# Patient Record
Sex: Female | Born: 1995 | Race: Black or African American | Hispanic: No | Marital: Single | Smoking: Never smoker
Health system: Southern US, Community
[De-identification: ages and names within clinical notes are randomized; demographics above are authoritative.]

## PROBLEM LIST (undated history)

## (undated) DIAGNOSIS — R569 Unspecified convulsions: Secondary | ICD-10-CM

## (undated) DIAGNOSIS — J302 Other seasonal allergic rhinitis: Secondary | ICD-10-CM

---
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
## 2016-01-08 ENCOUNTER — Ambulatory Visit: Payer: Self-pay | Admitting: Gastroenterology

## 2016-01-08 ENCOUNTER — Other Ambulatory Visit: Payer: Self-pay

## 2016-09-02 ENCOUNTER — Emergency Department: Payer: Medicaid Other

## 2016-09-02 ENCOUNTER — Encounter: Payer: Self-pay | Admitting: Urgent Care

## 2016-09-02 DIAGNOSIS — Z79899 Other long term (current) drug therapy: Secondary | ICD-10-CM | POA: Diagnosis not present

## 2016-09-02 DIAGNOSIS — R51 Headache: Secondary | ICD-10-CM | POA: Insufficient documentation

## 2016-09-02 NOTE — ED Triage Notes (Signed)
Patient presents with c/o an occipital headache that began earlier tonight. Patient is dizzy. Denies neck pain and visual changes. 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 Also, patient with a first line relative with a PMH significant for a cerebral aneurysm.

## 2016-09-02 NOTE — ED Notes (Signed)
Brown,MD consulted. MD made aware of presenting complaints and triage assessment. MD with VORB for CT angio of head with contrast. Orders to be entered and carried by this RN.

## 2016-09-03 ENCOUNTER — Emergency Department
Admission: EM | Admit: 2016-09-03 | Discharge: 2016-09-03 | Disposition: A | Discharge status: Home / Self Care | Payer: Medicaid Other | Attending: Emergency Medicine | Admitting: Emergency Medicine

## 2016-09-03 DIAGNOSIS — R51 Headache: Secondary | ICD-10-CM

## 2016-09-03 DIAGNOSIS — R519 Headache, unspecified: Secondary | ICD-10-CM

## 2016-09-03 HISTORY — DX: Unspecified convulsions: R56.9

## 2016-09-03 LAB — POCT PREGNANCY, URINE: PREG TEST UR: NEGATIVE

## 2016-09-03 MED ORDER — IBUPROFEN 600 MG PO TABS
600.0000 mg | ORAL_TABLET | Freq: Once | ORAL | Status: AC
  Administered 2016-09-03: 600 mg via ORAL
  Filled 2016-09-03: qty 1

## 2016-09-03 MED ORDER — IOPAMIDOL (ISOVUE-370) INJECTION 76%
75.0000 mL | Freq: Once | INTRAVENOUS | Status: AC | PRN
  Administered 2016-09-03: 75 mL via INTRAVENOUS

## 2016-09-03 NOTE — ED Notes (Signed)
MD at bedside. 

## 2016-09-09 NOTE — ED Provider Notes (Signed)
[facility] Emergency Department Provider Note    First MD Initiated Contact with Patient 09/03/16 0143     (approximate)  I have reviewed the triage vital signs and the nursing notes.   HISTORY  Chief Complaint Headache and Dizziness   HPI Jean Knapp is a 20 y.o. female presents emergency department with occipital headache which began earlier tonight. Patient admits to feeling dizzy however dizziness is resolved. Patient denies any visual changes no neck pain or stiffness. Patient's mother states that the child had a blood clot in her brain as a baby and as a result had a seizure disorder following. Patient's mother also states that there is a history of cerebral aneurysms in the family   Past Medical History:  Diagnosis Date  . Seasonal allergies   . Seizures [facility](HCC)     Patient Active Problem List   Diagnosis Date Noted  . Bradycardia 07/16/2015  . Early satiety 07/16/2015  . Abnormal weight loss 07/16/2015    No past surgical history on file.  Prior to Admission medications   Medication Sig Start Date End Date Taking? Authorizing Provider  cetirizine (ZYRTEC) 10 MG tablet Take 10 mg by mouth.    Historical Provider, MD  polyethylene glycol powder (GLYCOLAX/MIRALAX) powder Take by mouth. 07/18/15   Historical Provider, MD    Allergies No known drug allergies No family history on file.  Social History Social History  Substance Use Topics  . Smoking status: Never Smoker  . Smokeless tobacco: Never Used  . Alcohol use No    Review of Systems Constitutional: No fever/chills Eyes: No visual changes. ENT: No sore throat. Cardiovascular: Denies chest pain. Respiratory: Denies shortness of breath. Gastrointestinal: No abdominal pain.  No nausea, no vomiting.  No diarrhea.  No constipation. Genitourinary: Negative for dysuria. Musculoskeletal: Negative for back pain. Skin: Negative for rash. Neurological: Positive for headaches,  negative for focal weakness or numbness.  10-point ROS otherwise negative.  ____________________________________________   PHYSICAL EXAM:  VITAL SIGNS: ED Triage Vitals  Enc Vitals Group     BP 09/02/16 2317 (!) 144/82     Pulse Rate 09/02/16 2317 80     Resp 09/02/16 2317 16     Temp 09/02/16 2317 98.6 F (37 C)     Temp Source 09/02/16 2317 Oral     SpO2 09/02/16 2317 100 %     Weight 09/02/16 2318 121 lb (54.9 kg)     Height 09/02/16 2318 5\' 1"  (1.549 m)     Head Circumference --      Peak Flow --      Pain Score 09/02/16 2324 7     Pain Loc --      Pain Edu? --      Excl. in GC? --     Constitutional: Alert and oriented. Well appearing and in no acute distress. Eyes: Conjunctivae are normal. PERRL. EOMI. Head: Atraumatic. Ears:  Healthy appearing ear canals and TMs bilaterally Nose: No congestion/rhinnorhea. Mouth/Throat: Mucous membranes are moist.  Oropharynx non-erythematous. Neck: No stridor.  No meningeal signs.  No cervical spine tenderness to palpation Cardiovascular: Normal rate, regular rhythm. Good peripheral circulation. Grossly normal heart sounds. Respiratory: Normal respiratory effort.  No retractions. Lungs CTAB. Gastrointestinal: Soft and nontender. No distention.  Musculoskeletal: No lower extremity tenderness nor edema. No gross deformities of extremities. Neurologic:  Normal speech and language. No gross focal neurologic deficits are appreciated.  Skin:  Skin is warm, dry and intact. No rash  noted. Psychiatric: Mood and affect are normal. Speech and behavior are normal.  ____________________________________________   LABS (all labs ordered are listed, but only abnormal results are displayed)  Labs Reviewed  POCT PREGNANCY, URINE   ____________________________ RADIOLOGY I, Paradise N K[place] Throckmorton, personally viewed and evaluated these images (plain radiographs) as part of my medical decision making, as well as reviewing the written report by the  radiologist.  CLINICAL DATA:  Initial evaluation for acute occipital headache.  EXAM: CT ANGIOGRAPHY HEAD  TECHNIQUE: Multidetector CT imaging of the head was performed using the standard protocol during bolus administration of intravenous contrast. Multiplanar CT image reconstructions and MIPs were obtained to evaluate the vascular anatomy.  CONTRAST:  75 cc of Isovue 370.  COMPARISON:  Prior head CT from 08/26/2015.  FINDINGS: CT HEAD  Brain: Cerebral volume within normal limits. Focal encephalomalacia within the posterior left frontal region is stable from prior, which may related to remote ischemia. No acute intracranial hemorrhage. No evidence for acute large vessel territory infarct. No mass lesion, midline shift or mass effect. No hydrocephalus. No extra-axial fluid collection.  Vascular: No hyperdense vessel.  Skull: Scalp soft tissues within normal limits.  Calvarium intact.  Sinuses: Visualized paranasal sinuses are clear. Visualize mastoids are well pneumatized.  Orbits: Visualized globes and orbits within normal limits.  CTA HEAD  Anterior circulation: Distal cervical segments of the internal carotid arteries are widely patent. Petrous, cavernous, and supraclinoid segments widely and normal in appearance. A1 segments patent. Anterior communicating artery normal. Anterior cerebral arteries well opacified to their distal aspects. M1 segments widely patent without stenosis or occlusion. MCA bifurcations normal. Distal MCA branches well opacified and symmetric.  Posterior circulation: Vertebral arteries patent to the vertebrobasilar junction. Posterior inferior cerebral arteries patent proximally. Basilar artery widely patent. Superior cerebral arteries patent bilaterally. Both the posterior cerebral arteries arise the basilar artery and are well opacified to their distal aspects.  Venous sinuses: Patent.  Anatomic variants: No significant  anatomic variant. No aneurysm or vascular malformation.  Delayed phase: No pathologic enhancement.  IMPRESSION: 1. Normal CTA of the brain. 2. No acute intracranial process identified. 3. 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   Electronically Signed   By: Rise Mu M.D.   On: 09/03/2016 01:20 ____________________________________________     Procedures     INITIAL IMPRESSION / ASSESSMENT AND PLAN / ED COURSE  Pertinent labs & imaging results that were available during my care of the patient were reviewed by me and considered in my medical decision making (see chart for details).  CT angiogram of the brain performed revealing a normal CTA of the brain per the radiologist as listed above. Patient had no focal neurological deficits. Patient states her headache is resolved before discharge from the emergency room.   Clinical Course    ____________________________________________  FINAL CLINICAL IMPRESSION(S) / ED DIAGNOSES  Final diagnoses:  Acute nonintractable headache, unspecified headache type     MEDICATIONS GIVEN DURING THIS VISIT:  Medications  iopamidol (ISOVUE-370) 76 % injection 75 mL (75 mLs Intravenous Contrast Given 09/03/16 0026)  ibuprofen (ADVIL,MOTRIN) tablet 600 mg (600 mg Oral Given 09/03/16 0213)     NEW OUTPATIENT MEDICATIONS STARTED DURING THIS VISIT:  Discharge Medication List as of 09/03/2016  2:36 AM      Discharge Medication List as of 09/03/2016  2:36 AM      Discharge Medication List as of 09/03/2016  2:36 AM  Note:  This document was prepared using Dragon voice recognition software and may include unintentional dictation errors.    Darci Currentandolph N Hannah Strader, MD 09/09/16 [phone]

## 2016-11-24 ENCOUNTER — Ambulatory Visit: Payer: Medicaid Other | Attending: Pediatrics | Admitting: Pediatrics

## 2016-12-02 IMAGING — CT CT ANGIO HEAD
3 of 11 series · 15 of 47 positions shown · IV contrast (APPLIED)
Comparison: Prior head CT from 08/26/2015.

CLINICAL DATA: Initial evaluation for acute occipital headache.

EXAM:
CT ANGIOGRAPHY HEAD
TECHNIQUE: Multidetector CT imaging of the head was performed using the
standard protocol during bolus administration of intravenous
contrast. Multiplanar CT image reconstructions and MIPs were
obtained to evaluate the vascular anatomy.
CONTRAST:  75 cc of Isovue 370.

[Series 11: ax thin · axial · 0.32mm/px · z∈[-31,+84]mm · 9 of 137 slices shown]
[im 11/137  brain]
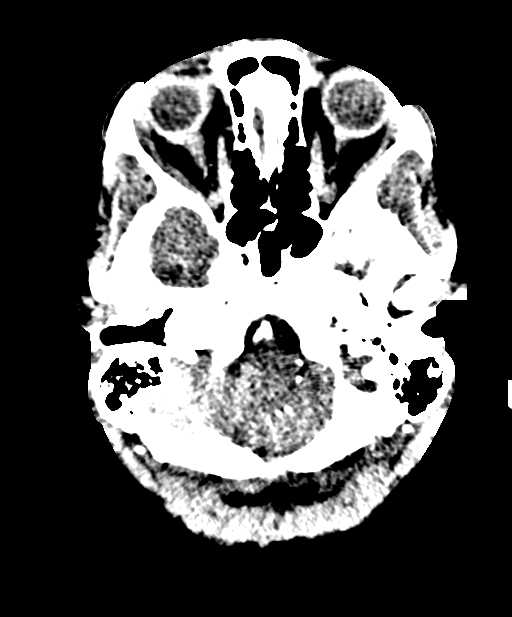
[im 32/137  bone]
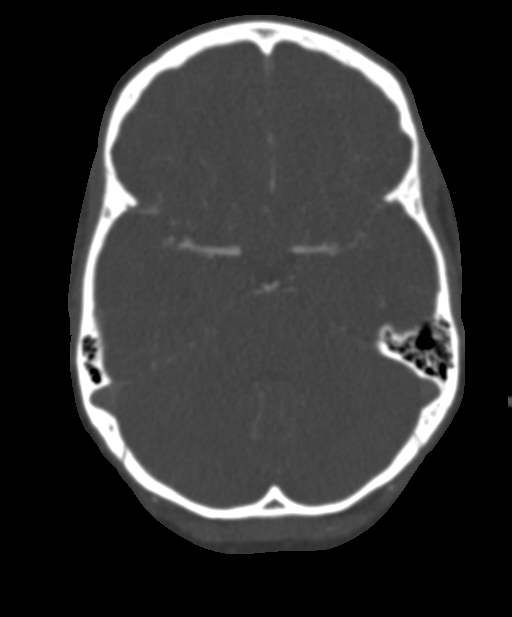
[im 42/137  brain]
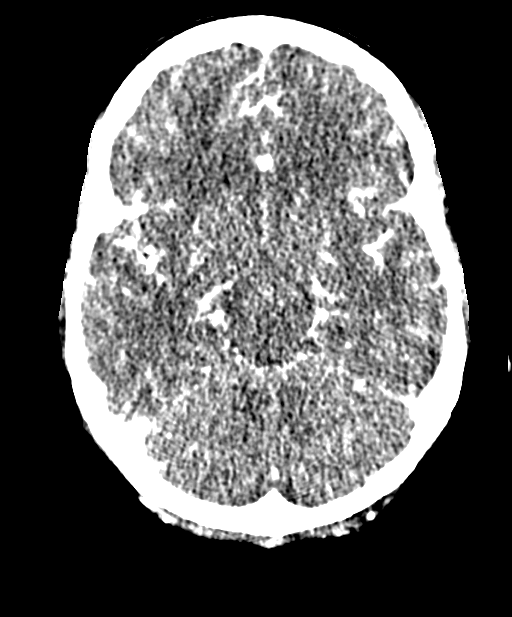
[im 53/137  bone]
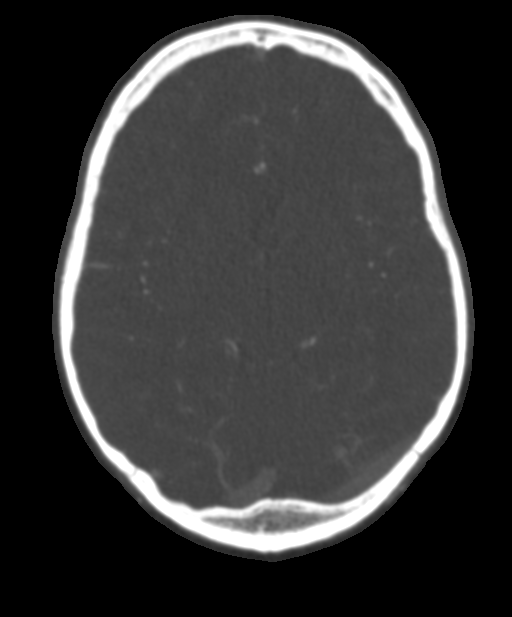
[im 74/137  brain]
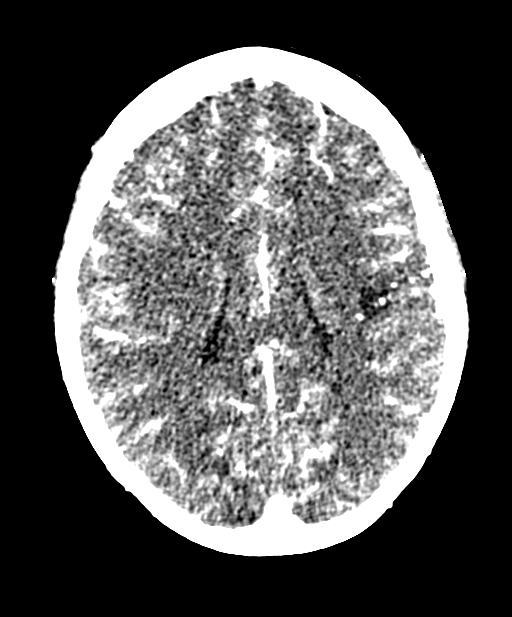
[im 84/137  bone]
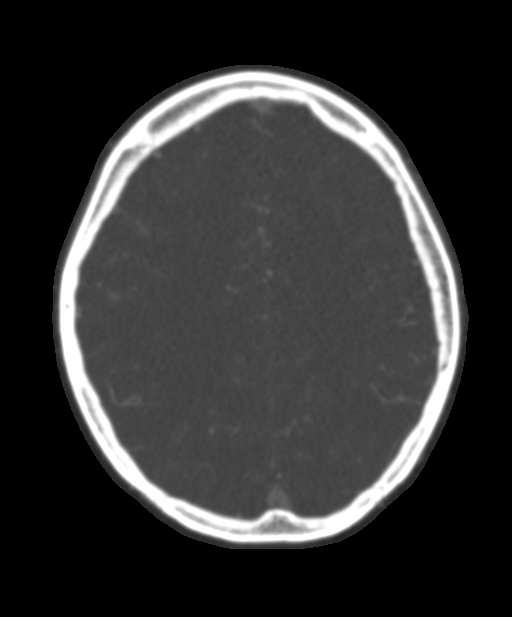
[im 95/137  brain]
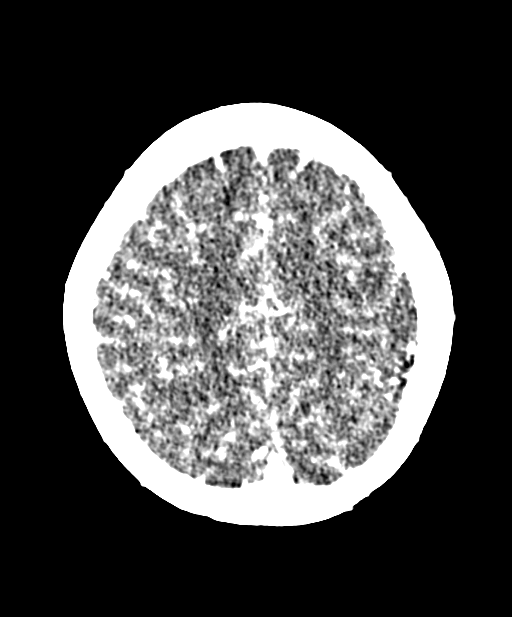
[im 116/137  bone]
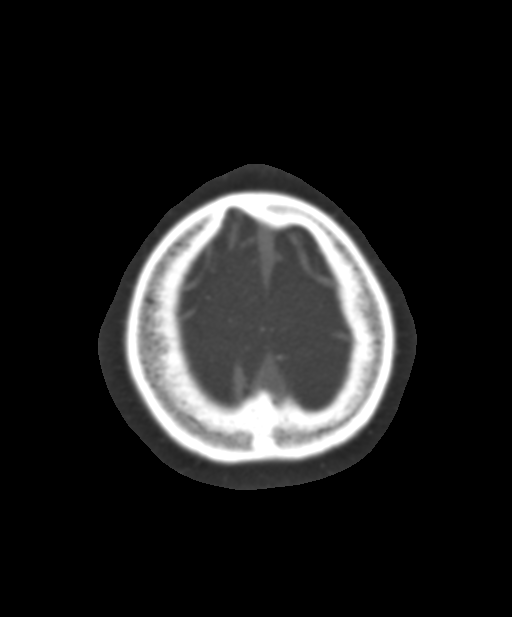
[im 126/137  brain]
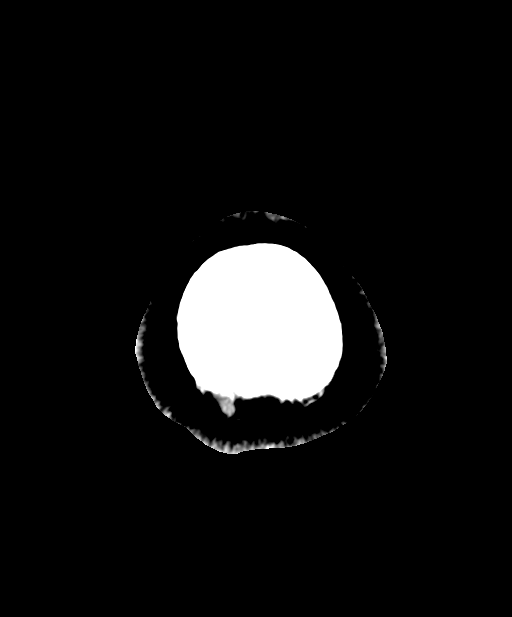

[Series 13: cor thin · coronal · 0.28mm/px · 3 of 175 slices shown]
[im 35/175  brain]
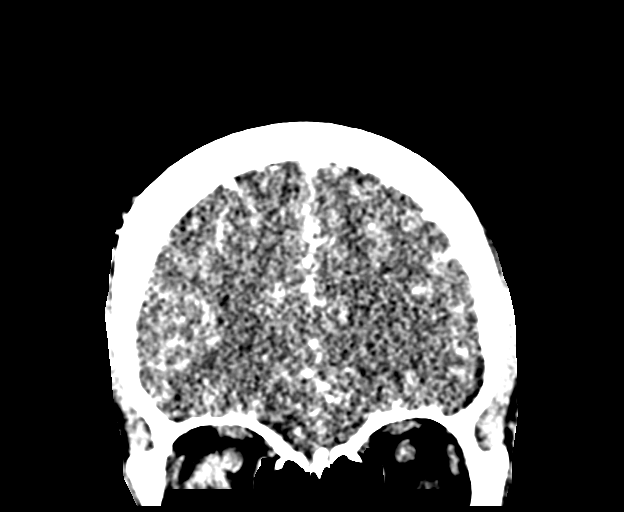
[im 70/175  brain]
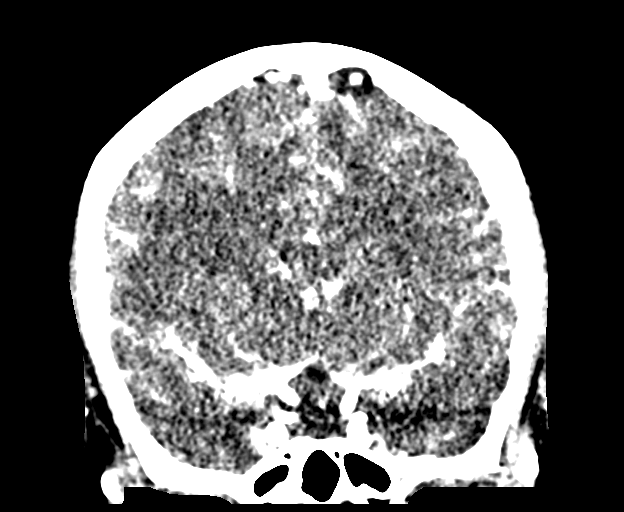
[im 105/175  brain]
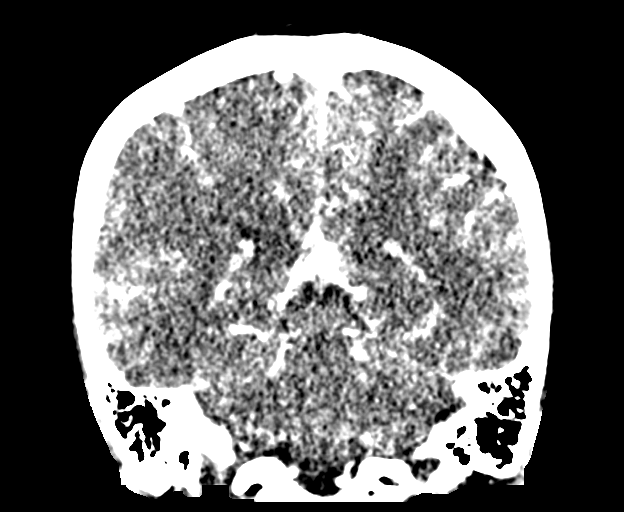

[Series 15: sag thin · sagittal · 0.30mm/px · 3 of 145 slices shown]
[im 37/145  brain]
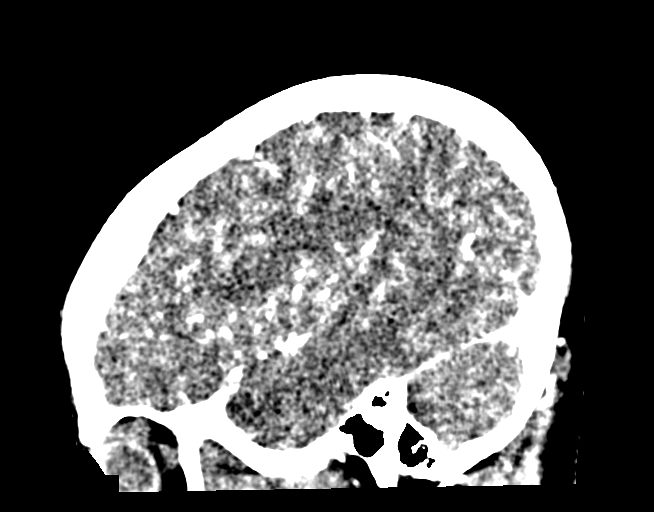
[im 73/145  brain]
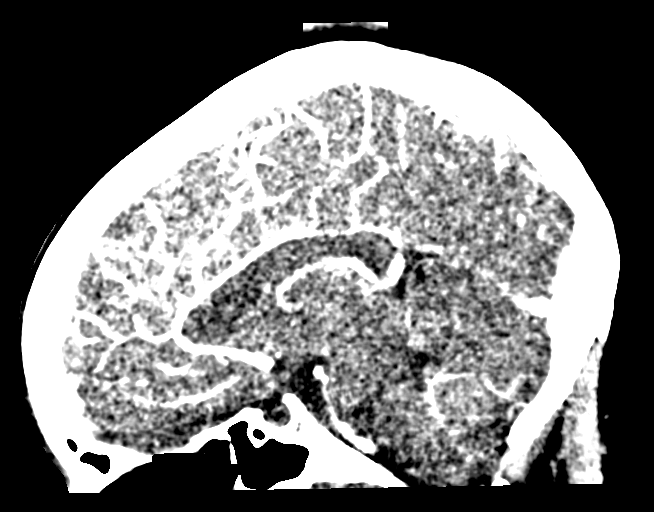
[im 109/145  brain]
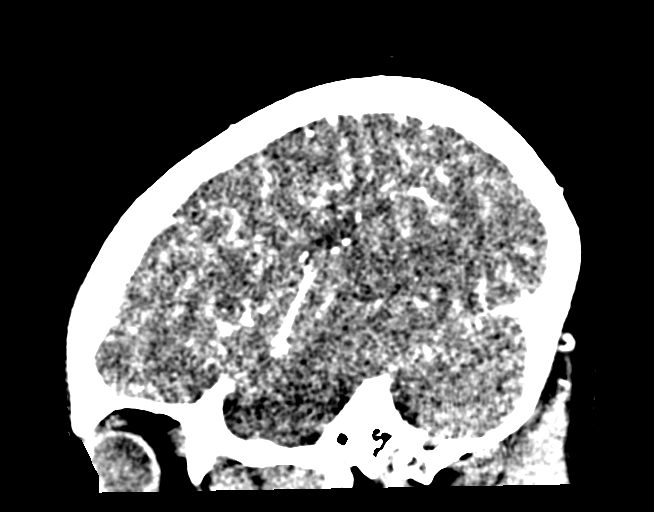

[15 of 47 positions shown; findings below may reference images not displayed]

FINDINGS: CT HEAD

Brain: Cerebral volume within normal limits. Focal encephalomalacia
within the posterior left frontal region is stable from prior, which
may related to remote ischemia. No acute intracranial hemorrhage. No
evidence for acute large vessel territory infarct. No mass lesion,
midline shift or mass effect. No hydrocephalus. No extra-axial fluid
collection.

Vascular: No hyperdense vessel.

Skull: Scalp soft tissues within normal limits.  Calvarium intact.

Sinuses: Visualized paranasal sinuses are clear. Visualize mastoids
are well pneumatized.

Orbits: Visualized globes and orbits within normal limits.

CTA HEAD

Anterior circulation: Distal cervical segments of the internal
carotid arteries are widely patent. Petrous, cavernous, and
supraclinoid segments widely and normal in appearance. A1 segments
patent. Anterior communicating artery normal. Anterior cerebral
arteries well opacified to their distal aspects. M1 segments widely
patent without stenosis or occlusion. MCA bifurcations normal.
Distal MCA branches well opacified and symmetric.

Posterior circulation: Vertebral arteries patent to the
vertebrobasilar junction. Posterior inferior cerebral arteries
patent proximally. Basilar artery widely patent. Superior cerebral
arteries patent bilaterally. Both the posterior cerebral arteries
arise the basilar artery and are well opacified to their distal
aspects.

Venous sinuses: Patent.

Anatomic variants: No significant anatomic variant. No aneurysm or
vascular malformation.

Delayed phase: No pathologic enhancement.
IMPRESSION: 1. Normal CTA of the brain.
2. No acute intracranial process identified.
3. Encephalomalacia within the posterior left frontal lobe, likely
related to remote ischemia, stable from prior.

## 2017-03-07 ENCOUNTER — Encounter: Payer: Self-pay | Admitting: Emergency Medicine

## 2017-03-07 ENCOUNTER — Emergency Department
Admission: EM | Admit: 2017-03-07 | Discharge: 2017-03-07 | Disposition: A | Discharge status: Home / Self Care | Payer: Medicaid Other | Attending: Emergency Medicine | Admitting: Emergency Medicine

## 2017-03-07 ENCOUNTER — Emergency Department: Payer: Medicaid Other

## 2017-03-07 DIAGNOSIS — W2209XA Striking against other stationary object, initial encounter: Secondary | ICD-10-CM | POA: Insufficient documentation

## 2017-03-07 DIAGNOSIS — Y929 Unspecified place or not applicable: Secondary | ICD-10-CM | POA: Diagnosis not present

## 2017-03-07 DIAGNOSIS — Y999 Unspecified external cause status: Secondary | ICD-10-CM | POA: Insufficient documentation

## 2017-03-07 DIAGNOSIS — S60012A Contusion of left thumb without damage to nail, initial encounter: Secondary | ICD-10-CM | POA: Diagnosis not present

## 2017-03-07 DIAGNOSIS — Y939 Activity, unspecified: Secondary | ICD-10-CM | POA: Insufficient documentation

## 2017-03-07 DIAGNOSIS — S6992XA Unspecified injury of left wrist, hand and finger(s), initial encounter: Secondary | ICD-10-CM | POA: Diagnosis present

## 2017-03-07 MED ORDER — NAPROXEN 500 MG PO TABS
500.0000 mg | ORAL_TABLET | Freq: Once | ORAL | Status: AC
  Administered 2017-03-07: 500 mg via ORAL
  Filled 2017-03-07: qty 1

## 2017-03-07 NOTE — ED Provider Notes (Addendum)
[facility] Emergency Department Provider Note   ____________________________________________   None    (approximate)  I have reviewed the triage vital signs and the nursing notes.   HISTORY  Chief Complaint Hand Pain    HPI Jean Knapp is a 21 y.o. female patient complaining of left thumb pain secondary to contusion a car door this morning. Patient states mild swelling and numbness to the left thumb. Patient left-hand dominant.   Past Medical History:  Diagnosis Date  . Seasonal allergies   . Seizures [facility])     Patient Active Problem List   Diagnosis Date Noted  . Bradycardia 07/16/2015  . Early satiety 07/16/2015  . Abnormal weight loss 07/16/2015    No past surgical history on file.  Prior to Admission medications   Medication Sig Start Date End Date Taking? Authorizing Provider  cetirizine (ZYRTEC) 10 MG tablet Take 10 mg by mouth.    Historical Provider, MD  polyethylene glycol powder (GLYCOLAX/MIRALAX) powder Take by mouth. 07/18/15   Historical Provider, MD    Allergies Patient has no known allergies.  No family history on file.  Social History Social History  Substance Use Topics  . Smoking status: Never Smoker  . Smokeless tobacco: Never Used  . Alcohol use No    Review of Systems  Constitutional: No fever/chills Eyes: No visual changes. ENT: No sore throat. Cardiovascular: Denies chest pain. Respiratory: Denies shortness of breath. Gastrointestinal: No abdominal pain.  No nausea, no vomiting.  No diarrhea.  No constipation. Genitourinary: Negative for dysuria. Musculoskeletal: Left thumb pain Skin: Negative for rash. Neurological: Negative for headaches, focal weakness or numbness. ____________________________________________   PHYSICAL EXAM:  VITAL SIGNS: ED Triage Vitals  Enc Vitals Group     BP 03/07/17 1042 130/90     Pulse Rate 03/07/17 1042 63     Resp 03/07/17 1042 16     Temp 03/07/17  1042 98.1 F (36.7 C)     Temp Source 03/07/17 1042 Oral     SpO2 03/07/17 1042 100 %     Weight 03/07/17 1040 121 lb (54.9 kg)     Height 03/07/17 1040  (1.549 m)     Head Circumference --      Peak Flow --      Pain Score 03/07/17 1040 8     Pain Loc --      Pain Edu? --      Excl. in GC? --     Constitutional: Alert and oriented. Well appearing and in no acute distress. Eyes: Conjunctivae are normal. PERRL. EOMI. Head: Atraumatic. Nose: No congestion/rhinnorhea. Mouth/Throat: Mucous membranes are moist.  Oropharynx non-erythematous. Neck: No stridor.  No cervical spine tenderness to palpation. Hematological/Lymphatic/Immunilogical: No cervical lymphadenopathy. Cardiovascular: Normal rate, regular rhythm. Grossly normal heart sounds.  Good peripheral circulation. Respiratory: Normal respiratory effort.  No retractions. Lungs CTAB. Gastrointestinal: Soft and nontender. No distention. No abdominal bruits. No CVA tenderness. Musculoskeletal: No lower extremity tenderness nor edema.  No joint effusions. Neurologic:  Normal speech and language. No gross focal neurologic deficits are appreciated. No gait instability. Skin:  Skin is warm, dry and intact. No rash noted. Psychiatric: Mood and affect are normal. Speech and behavior are normal.  ____________________________________________   LABS (all labs ordered are listed, but only abnormal results are displayed)  Labs Reviewed - No data to display ____________________________________________  EKG   ____________________________________________  RADIOLOGY  No acute findings x-ray of the left thumb ____________________________________________   PROCEDURES  Procedure(s) performed: None  Procedures  Critical Care performed: No  ____________________________________________   INITIAL IMPRESSION / ASSESSMENT AND PLAN / ED COURSE  Pertinent labs & imaging results that were available during my care of the patient  were reviewed by me and considered in my medical decision making (see chart for details).  Left thumb contusion. Discussed x-ray finding with patient. Patient placed in a thumb spica splint prior to departure. Advised to wear the splint for 3-5 days as needed. ____________________________________________   FINAL CLINICAL IMPRESSION(S) / ED DIAGNOSES  Final diagnoses:  Contusion of left thumb without damage to nail, initial encounter      NEW MEDICATIONS STARTED DURING THIS VISIT:  New Prescriptions   No medications on file     Note:  This document was prepared using Dragon voice recognition software and may include unintentional dictation errors.    Joni Reining, PA-C 03/07/17 1125    Jene Every, MD 03/07/17 1308    Joni Reining, PA-C 03/16/17 1544    Jene Every, MD 03/16/17 1630

## 2017-03-07 NOTE — Discharge Instructions (Signed)
Wear splint for2-3 days. °

## 2017-03-07 NOTE — ED Notes (Signed)
See triage note  States she shut her left thumb in car door   Swelling noted to left thumb   No deformity noted

## 2017-12-04 ENCOUNTER — Emergency Department
Admission: EM | Admit: 2017-12-04 | Discharge: 2017-12-04 | Disposition: A | Discharge status: Home / Self Care | Payer: Medicaid Other | Attending: Emergency Medicine | Admitting: Emergency Medicine

## 2017-12-04 ENCOUNTER — Encounter: Payer: Self-pay | Admitting: Emergency Medicine

## 2017-12-04 DIAGNOSIS — J069 Acute upper respiratory infection, unspecified: Secondary | ICD-10-CM | POA: Insufficient documentation

## 2017-12-04 DIAGNOSIS — B9789 Other viral agents as the cause of diseases classified elsewhere: Secondary | ICD-10-CM | POA: Diagnosis not present

## 2017-12-04 DIAGNOSIS — Z79899 Other long term (current) drug therapy: Secondary | ICD-10-CM | POA: Insufficient documentation

## 2017-12-04 DIAGNOSIS — R51 Headache: Secondary | ICD-10-CM | POA: Diagnosis present

## 2017-12-04 NOTE — ED Notes (Signed)
Pt says her mom thinks she may have the flu; pt c/o headache and body aches; pt ambulatory with steady gait

## 2017-12-04 NOTE — ED Triage Notes (Signed)
Pt comes into the ED via POV c/o headache that started last night.  Denies any h/o migraines.  Patient is neurologically intact at this time and in NAD with even and unlabored respirations.  Patient also explains she has a sore throat.

## 2017-12-04 NOTE — ED Provider Notes (Signed)
[facility] Emergency Department Provider Note   ____________________________________________    I have reviewed the triage vital signs and the nursing notes.   HISTORY  Chief Complaint Headache body aches sore throat    HPI Jean Knapp is a 22 y.o. female who presents with complaints of mild headache, body aches and a mild sore throat which started yesterday evening.  She denies fevers or chills.  No nausea or vomiting.  No chest pain or cough.  No shortness of breath.  Has not taken anything for this.  Was exposed to someone with the flu, did not get flu shot this year.   Past Medical History:  Diagnosis Date  . Seasonal allergies   . Seizures [facility])     Patient Active Problem List   Diagnosis Date Noted  . Bradycardia 07/16/2015  . Early satiety 07/16/2015  . Abnormal weight loss 07/16/2015    History reviewed. No pertinent surgical history.  Prior to Admission medications   Medication Sig Start Date End Date Taking? Authorizing Provider  cetirizine (ZYRTEC) 10 MG tablet Take 10 mg by mouth.    [provider]  polyethylene glycol powder (GLYCOLAX/MIRALAX) powder Take by mouth. 07/18/15   [provider]     Allergies Patient has no known allergies.  No family history on file.  Social History Social History   Tobacco Use  . Smoking status: Never Smoker  . Smokeless tobacco: Never Used  Substance Use Topics  . Alcohol use: No  . Drug use: No    Review of Systems  Constitutional: No fever/chills  ENT: Mild sore throat   Gastrointestinal: No abdominal pain.  No nausea, no vomiting.   Genitourinary: Negative for dysuria. Musculoskeletal: Negative for back pain.  Myalgias as above Skin: Negative for rash. Neurological: Mild headache    ____________________________________________   PHYSICAL EXAM:  VITAL SIGNS: ED Triage Vitals  Enc Vitals Group     BP 12/04/17 1923 122/82     Pulse Rate  12/04/17 1923 73     Resp 12/04/17 1923 15     Temp 12/04/17 1923 98 F (36.7 C)     Temp Source 12/04/17 1923 Oral     SpO2 12/04/17 1923 98 %     Weight 12/04/17 1922 56.7 kg (125 lb)     Height 12/04/17 1922 1.549 m (5\' 1" )     Head Circumference --      Peak Flow --      Pain Score 12/04/17 1921 7     Pain Loc --      Pain Edu? --      Excl. in GC? --      Constitutional: Alert and oriented. No acute distress. Pleasant and interactive Eyes: Conjunctivae are normal.  PERRLA  Nose: No congestion/rhinnorhea. Mouth/Throat: Mucous membranes are moist.  Pharynx is normal Cardiovascular: Normal rate, regular rhythm.  Respiratory: Normal respiratory effort.  No retractions.  Clear to auscultation bilaterally Genitourinary: deferred Musculoskeletal: No lower extremity tenderness nor edema.   Neurologic:  Normal speech and language. No gross focal neurologic deficits are appreciated.   Skin:  Skin is warm, dry and intact. No rash noted.   ____________________________________________   LABS (all labs ordered are listed, but only abnormal results are displayed)  Labs Reviewed - No data to display ____________________________________________  EKG   ____________________________________________  RADIOLOGY  None ____________________________________________   PROCEDURES  Procedure(s) performed: No  Procedures   Critical Care performed: No ____________________________________________  INITIAL IMPRESSION / ASSESSMENT AND PLAN / ED COURSE  Pertinent labs & imaging results that were available during my care of the patient were reviewed by me and considered in my medical decision making (see chart for details).  Patient well-appearing in no acute distress, exam is quite reassuring.  Vital signs are unremarkable.  Suspect she is suffering from viral upper respiratory infection, possibly the flu but unlikely given lack of fever and well-appearing exam.  Recommend  supportive care outpatient follow-up as needed.   ____________________________________________   FINAL CLINICAL IMPRESSION(S) / ED DIAGNOSES  Final diagnoses:  Viral upper respiratory tract infection      NEW MEDICATIONS STARTED DURING THIS VISIT:  Discharge Medication List as of 12/04/2017  8:04 PM       Note:  This document was prepared using Dragon voice recognition software and may include unintentional dictation errors.    Jene EveryKinner, Messina Kosinski, MD 12/04/17 2035

## 2017-12-10 ENCOUNTER — Encounter: Payer: Self-pay | Admitting: Emergency Medicine

## 2017-12-10 ENCOUNTER — Other Ambulatory Visit: Payer: Self-pay

## 2017-12-10 ENCOUNTER — Emergency Department
Admission: EM | Admit: 2017-12-10 | Discharge: 2017-12-10 | Disposition: A | Discharge status: Home / Self Care | Payer: Medicaid Other | Attending: Emergency Medicine | Admitting: Emergency Medicine

## 2017-12-10 DIAGNOSIS — J029 Acute pharyngitis, unspecified: Secondary | ICD-10-CM | POA: Diagnosis present

## 2017-12-10 DIAGNOSIS — Z79899 Other long term (current) drug therapy: Secondary | ICD-10-CM | POA: Diagnosis not present

## 2017-12-10 LAB — GROUP A STREP BY PCR: Group A Strep by PCR: NOT DETECTED

## 2017-12-10 MED ORDER — AMOXICILLIN 875 MG PO TABS: 875.0000 mg | ORAL_TABLET | Freq: Two times a day (BID) | ORAL | 0 refills | Status: DC

## 2017-12-10 NOTE — Discharge Instructions (Signed)
Follow-up with your regular doctor or the acute care if you are not better in 3-5 days.  Use Tylenol and ibuprofen as needed for fever if needed.  Use the medication as prescribed.  Gargle with warm salt water.  If you are worsening please return the emergency department

## 2017-12-10 NOTE — ED Provider Notes (Signed)
[facility] Emergency Department Provider Note  ____________________________________________   First MD Initiated Contact with Patient 12/10/17 1424     (approximate)  I have reviewed the triage vital signs and the nursing notes.   HISTORY  Chief Complaint No chief complaint on file.    HPI Jean Knapp is a 22 y.o. female complains of sore throat and congestion.  States she has a lot of sinus congestion cannot breathe through her nose very well.  She states is worse at night.  She is having some frontal sinus pain.  She denies any vomiting or diarrhea.  She denies chest pain or shortness of breath.  She was seen here last week and told it was a virus and she did not receive antibiotics at that time.  Past Medical History:  Diagnosis Date  . Seasonal allergies   . Seizures [facility](HCC)     Patient Active Problem List   Diagnosis Date Noted  . Bradycardia 07/16/2015  . Early satiety 07/16/2015  . Abnormal weight loss 07/16/2015    History reviewed. No pertinent surgical history.  Prior to Admission medications   Medication Sig Start Date End Date Taking? Authorizing Provider  amoxicillin (AMOXIL) 875 MG tablet Take 1 tablet (875 mg total) by mouth 2 (two) times daily. 12/10/17   Charizma Gardiner, Roselyn BeringSusan W, PA-C  cetirizine (ZYRTEC) 10 MG tablet Take 10 mg by mouth.    [provider]  polyethylene glycol powder (GLYCOLAX/MIRALAX) powder Take by mouth. 07/18/15   [provider]    Allergies Patient has no known allergies.  History reviewed. No pertinent family history.  Social History Social History   Tobacco Use  . Smoking status: Never Smoker  . Smokeless tobacco: Never Used  Substance Use Topics  . Alcohol use: No  . Drug use: No    Review of Systems  Constitutional: No fever/chills Eyes: No visual changes. ENT: Positive sore throat.  Positive for sinus pain and congestion Respiratory: Positive cough Genitourinary:  Negative for dysuria. Musculoskeletal: Negative for back pain. Skin: Negative for rash.    ____________________________________________   PHYSICAL EXAM:  VITAL SIGNS: ED Triage Vitals [12/10/17 1250]  Enc Vitals Group     BP (!) 132/100     Pulse Rate (!) 105     Resp 16     Temp 98.9 F (37.2 C)     Temp Source Oral     SpO2 97 %     Weight 125 lb (56.7 kg)     Height 5\' 1"  (1.549 m)     Head Circumference      Peak Flow      Pain Score 8     Pain Loc      Pain Edu?      Excl. in GC?     Constitutional: Alert and oriented. Well appearing and in no acute distress. Eyes: Conjunctivae are normal.  Head: Atraumatic. Nose: Positive for active congestion/rhinnorhea. Mouth/Throat: Mucous membranes are moist.  It is red, tonsils are swollen Cardiovascular: Normal rate, regular rhythm.  Heart sounds are normal Respiratory: Normal respiratory effort.  No retractions, lungs clear to auscultation GU: deferred Musculoskeletal: FROM all extremities, warm and well perfused Neurologic:  Normal speech and language.  Skin:  Skin is warm, dry and intact. No rash noted. Psychiatric: Mood and affect are normal. Speech and behavior are normal.  ____________________________________________   LABS (all labs ordered are listed, but only abnormal results are displayed)  Labs Reviewed  GROUP A  STREP BY PCR   ____________________________________________   ____________________________________________  RADIOLOGY    ____________________________________________   PROCEDURES  Procedure(s) performed: No  Procedures    ____________________________________________   INITIAL IMPRESSION / ASSESSMENT AND PLAN / ED COURSE  Pertinent labs & imaging results that were available during my care of the patient were reviewed by me and considered in my medical decision making (see chart for details).  Patient is 22 year old female complaining of sore throat and sinus congestion.  She  was here last week and was told it was viral.  She states she is only gotten worse since then.  On physical exam patient is afebrile and appears basically well.  She does have some active nasal congestion.  Her throat is red and swollen without exudates.  The remainder of the exam is benign  Strep test is negative  Patient is diagnosed with acute pharyngitis.  Given a prescription for amoxicillin 875 twice daily for 10 days.  She is to follow-up with her regular doctor if not better in 3-5 days.  She is gargle with warm salt water.  She is to take Tylenol and ibuprofen as needed for fever.  She is to take over-the-counter cold medicines as needed for nasal congestion.  Patient states she understands will comply with our recommendations.  She was discharged in stable condition     As part of my medical decision making, I reviewed the following data within the electronic MEDICAL RECORD NUMBER Nursing notes reviewed and incorporated, Old chart reviewed, Notes from prior ED visits, labs reviewed, strep test is negative ____________________________________________   FINAL CLINICAL IMPRESSION(S) / ED DIAGNOSES  Final diagnoses:  Acute pharyngitis, unspecified etiology      NEW MEDICATIONS STARTED DURING THIS VISIT:  Discharge Medication List as of 12/10/2017  2:28 PM    START taking these medications   Details  amoxicillin (AMOXIL) 875 MG tablet Take 1 tablet (875 mg total) by mouth 2 (two) times daily., Starting Sat 12/10/2017, Print         Note:  This document was prepared using Dragon voice recognition software and may include unintentional dictation errors.    Faythe Ghee, PA-C 12/10/17 1442    Jeanmarie Plant, MD 12/10/17 [phone]

## 2017-12-10 NOTE — ED Triage Notes (Signed)
Here Sunday and dx with URI. Reports congestion worse and cannot breath through nose well especially at night. Some pain to frontal sinuses.  No fevers.  Throat sore.  Does feel drainage from nose.  Slight redness to tonsils, no exudate visualized.  Does report yellow drainage from nose.

## 2018-04-07 ENCOUNTER — Encounter: Payer: Self-pay | Admitting: *Deleted

## 2018-04-07 ENCOUNTER — Other Ambulatory Visit: Payer: Self-pay

## 2018-04-07 DIAGNOSIS — M542 Cervicalgia: Secondary | ICD-10-CM | POA: Diagnosis not present

## 2018-04-07 DIAGNOSIS — Z79899 Other long term (current) drug therapy: Secondary | ICD-10-CM | POA: Diagnosis not present

## 2018-04-07 DIAGNOSIS — R51 Headache: Secondary | ICD-10-CM | POA: Insufficient documentation

## 2018-04-07 DIAGNOSIS — Z041 Encounter for examination and observation following transport accident: Secondary | ICD-10-CM | POA: Diagnosis not present

## 2018-04-07 DIAGNOSIS — M549 Dorsalgia, unspecified: Secondary | ICD-10-CM | POA: Diagnosis not present

## 2018-04-07 DIAGNOSIS — M791 Myalgia, unspecified site: Secondary | ICD-10-CM | POA: Diagnosis not present
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
## 2018-04-08 ENCOUNTER — Emergency Department
Admission: EM | Admit: 2018-04-08 | Discharge: 2018-04-08 | Disposition: A | Discharge status: Home / Self Care | Payer: Medicaid Other | Attending: Emergency Medicine | Admitting: Emergency Medicine

## 2018-04-08 ENCOUNTER — Emergency Department: Payer: Medicaid Other

## 2018-04-08 DIAGNOSIS — M7918 Myalgia, other site: Secondary | ICD-10-CM

## 2018-04-08 LAB — POCT PREGNANCY, URINE: PREG TEST UR: NEGATIVE

## 2018-04-08 MED ORDER — CYCLOBENZAPRINE HCL 10 MG PO TABS
10.0000 mg | ORAL_TABLET | Freq: Once | ORAL | Status: AC
  Administered 2018-04-08: 10 mg via ORAL
  Filled 2018-04-08: qty 1

## 2018-04-08 NOTE — ED Notes (Signed)
Pregnancy test negative
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
## 2018-04-09 ENCOUNTER — Emergency Department
Admission: EM | Admit: 2018-04-09 | Discharge: 2018-04-10 | Disposition: A | Discharge status: Home / Self Care | Payer: Medicaid Other | Attending: Emergency Medicine | Admitting: Emergency Medicine

## 2018-04-09 ENCOUNTER — Encounter: Payer: Self-pay | Admitting: Emergency Medicine

## 2018-04-09 ENCOUNTER — Other Ambulatory Visit: Payer: Self-pay

## 2018-04-09 DIAGNOSIS — S161XXD Strain of muscle, fascia and tendon at neck level, subsequent encounter: Secondary | ICD-10-CM | POA: Insufficient documentation

## 2018-04-09 DIAGNOSIS — R200 Anesthesia of skin: Secondary | ICD-10-CM | POA: Diagnosis present

## 2018-04-09 DIAGNOSIS — M5412 Radiculopathy, cervical region: Secondary | ICD-10-CM | POA: Diagnosis not present

## 2018-04-09 DIAGNOSIS — Z79899 Other long term (current) drug therapy: Secondary | ICD-10-CM | POA: Diagnosis not present

## 2018-04-09 MED ORDER — METHOCARBAMOL 500 MG PO TABS: 500.0000 mg | ORAL_TABLET | Freq: Four times a day (QID) | ORAL | 0 refills | Status: DC

## 2018-04-09 MED ORDER — MELOXICAM 15 MG PO TABS: 15.0000 mg | ORAL_TABLET | Freq: Every day | ORAL | 0 refills | Status: DC

## 2018-04-09 MED ORDER — KETOROLAC TROMETHAMINE 30 MG/ML IJ SOLN
30.0000 mg | Freq: Once | INTRAMUSCULAR | Status: AC
  Administered 2018-04-09: 30 mg via INTRAMUSCULAR
  Filled 2018-04-09: qty 1

## 2018-04-09 MED ORDER — ORPHENADRINE CITRATE 30 MG/ML IJ SOLN
60.0000 mg | Freq: Once | INTRAMUSCULAR | Status: AC
  Administered 2018-04-09: 60 mg via INTRAMUSCULAR
  Filled 2018-04-09: qty 2
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
## 2018-04-10 NOTE — ED Notes (Signed)
Pt discharged to home.  Family member driving.  Discharge instructions reviewed.  Verbalized understanding.  No questions or concerns at this time.  Teach back verified.  Pt in NAD.  No items left in ED.   

## 2018-05-13 ENCOUNTER — Encounter: Payer: Self-pay | Admitting: Emergency Medicine

## 2018-05-13 ENCOUNTER — Other Ambulatory Visit: Payer: Self-pay

## 2018-05-13 ENCOUNTER — Emergency Department
Admission: EM | Admit: 2018-05-13 | Discharge: 2018-05-13 | Disposition: A | Discharge status: Home / Self Care | Payer: Medicaid Other | Attending: Emergency Medicine | Admitting: Emergency Medicine

## 2018-05-13 DIAGNOSIS — K625 Hemorrhage of anus and rectum: Secondary | ICD-10-CM | POA: Insufficient documentation

## 2018-05-13 DIAGNOSIS — Z79899 Other long term (current) drug therapy: Secondary | ICD-10-CM | POA: Insufficient documentation

## 2018-05-13 LAB — CBC WITH DIFFERENTIAL/PLATELET
BASOS ABS: 0 10*3/uL (ref 0–0.1)
Basophils Relative: 1 %
Eosinophils Absolute: 0.2 10*3/uL (ref 0–0.7)
Eosinophils Relative: 3 %
HEMATOCRIT: 40.9 % (ref 35.0–47.0)
HEMOGLOBIN: 14 g/dL (ref 12.0–16.0)
LYMPHS ABS: 2.2 10*3/uL (ref 1.0–3.6)
LYMPHS PCT: 35 %
MCH: 29.7 pg (ref 26.0–34.0)
MCHC: 34.1 g/dL (ref 32.0–36.0)
MCV: 87.1 fL (ref 80.0–100.0)
Monocytes Absolute: 0.4 10*3/uL (ref 0.2–0.9)
Monocytes Relative: 7 %
NEUTROS ABS: 3.5 10*3/uL (ref 1.4–6.5)
NEUTROS PCT: 56 %
PLATELETS: 241 10*3/uL (ref 150–440)
RBC: 4.7 MIL/uL (ref 3.80–5.20)
RDW: 13.1 % (ref 11.5–14.5)
WBC: 6.3 10*3/uL (ref 3.6–11.0)

## 2018-05-13 LAB — LIPASE, BLOOD: LIPASE: 46 U/L (ref 11–51)

## 2018-05-13 LAB — COMPREHENSIVE METABOLIC PANEL
ALT: 19 U/L (ref 0–44)
ANION GAP: 11 (ref 5–15)
AST: 19 U/L (ref 15–41)
Albumin: 3.9 g/dL (ref 3.5–5.0)
Alkaline Phosphatase: 62 U/L (ref 38–126)
BUN: 10 mg/dL (ref 6–20)
CHLORIDE: 107 mmol/L (ref 98–111)
CO2: 23 mmol/L (ref 22–32)
CREATININE: 0.86 mg/dL (ref 0.44–1.00)
Calcium: 9.2 mg/dL (ref 8.9–10.3)
Glucose, Bld: 114 mg/dL — ABNORMAL HIGH (ref 70–99)
POTASSIUM: 4 mmol/L (ref 3.5–5.1)
SODIUM: 141 mmol/L (ref 135–145)
Total Bilirubin: 0.9 mg/dL (ref 0.3–1.2)
Total Protein: 6.8 g/dL (ref 6.5–8.1)

## 2018-05-13 NOTE — ED Provider Notes (Signed)
[facility] Emergency Department Provider Note   ____________________________________________    I have reviewed the triage vital signs and the nursing notes.   HISTORY  Chief Complaint Rectal Bleeding and Abdominal Pain     HPI Jean Knapp is a 22 y.o. female who presents with complaints of rectal bleeding.  Patient reports after having a bowel movement earlier today she noticed blood in the toilet and on the toilet paper.  Then later she felt like she needed to have diarrhea and when she sat on the toilet mainly blood came out.  She reports a history of hemorrhoids and her family and has been checked before via visual exam.  She stated before she thought she had diarrhea she had some abdominal cramping but that has resolved.  No abdominal pain.  No history of Crohn's disease in her family.  No nausea or vomiting otherwise feels well.    Past Medical History:  Diagnosis Date  . Seasonal allergies   . Seizures [facility])     Patient Active Problem List   Diagnosis Date Noted  . Bradycardia 07/16/2015  . Early satiety 07/16/2015  . Abnormal weight loss 07/16/2015    History reviewed. No pertinent surgical history.  Prior to Admission medications   Medication Sig Start Date End Date Taking? Authorizing Provider  meloxicam (MOBIC) 15 MG tablet Take 1 tablet (15 mg total) by mouth daily. 04/09/18   Cuthriell, Delorise Royals, PA-C  methocarbamol (ROBAXIN) 500 MG tablet Take 1 tablet (500 mg total) by mouth 4 (four) times daily. 04/09/18   Cuthriell, Delorise Royals, PA-C     Allergies Patient has no known allergies.  No family history on file.  Social History Social History   Tobacco Use  . Smoking status: Never Smoker  . Smokeless tobacco: Never Used  Substance Use Topics  . Alcohol use: No  . Drug use: No    Review of Systems  Constitutional: No fever/chills Eyes: No visual changes.  ENT: No neck pain Cardiovascular: Denies chest  pain. Respiratory: Denies shortness of breath. Gastrointestinal: As above Genitourinary: Negative for dysuria. Musculoskeletal: Negative for joint swelling Skin: Negative for rash. Neurological: Negative for headaches   ____________________________________________   PHYSICAL EXAM:  VITAL SIGNS: ED Triage Vitals  Enc Vitals Group     BP 05/13/18 0710 (!) 130/99     Pulse Rate 05/13/18 0710 68     Resp 05/13/18 0710 18     Temp 05/13/18 0710 98.4 F (36.9 C)     Temp Source 05/13/18 0710 Oral     SpO2 05/13/18 0710 100 %     Weight 05/13/18 0712 56.7 kg (125 lb)     Height 05/13/18 0712 1.549 m (5\' 1" )     Head Circumference --      Peak Flow --      Pain Score 05/13/18 0711 6     Pain Loc --      Pain Edu? --      Excl. in GC? --     Constitutional: Alert and oriented. No acute distress. Eyes: Conjunctivae are normal.    Mouth/Throat: Mucous membranes are moist.    Cardiovascular: Normal rate, regular rhythm. Grossly normal heart sounds.  Good peripheral circulation. Respiratory: Normal respiratory effort.  No retractions Gastrointestinal: Soft and nontender. No distention.  Normal rectal exam, no hemorrhoids, on DRE small amount of bright red blood Genitourinary: deferred Musculoskeletal:   Warm and well perfused Neurologic:  Normal speech and language.  No gross focal neurologic deficits are appreciated.  Skin:  Skin is warm, dry and intact. No rash noted. Psychiatric: Mood and affect are normal. Speech and behavior are normal.  ____________________________________________   LABS (all labs ordered are listed, but only abnormal results are displayed)  Labs Reviewed  COMPREHENSIVE METABOLIC PANEL - Abnormal; Notable for the following components:      Result Value   Glucose, Bld 114 (*)    All other components within normal limits  LIPASE, BLOOD  CBC WITH DIFFERENTIAL/PLATELET    ____________________________________________  EKG  None ____________________________________________  RADIOLOGY  None ____________________________________________   PROCEDURES  Procedure(s) performed: No  Procedures   Critical Care performed: No ____________________________________________   INITIAL IMPRESSION / ASSESSMENT AND PLAN / ED COURSE  Pertinent labs & imaging results that were available during my care of the patient were reviewed by me and considered in my medical decision making (see chart for details).  Patient with reports of rectal bleeding as above, differential includes internal hemorrhoids, less likely inflammatory bowel disease.  Pending labs   Lab work is completely normal.  Hemoglobin is 14.  Suspect internal hemorrhoids however I have recommended follow-up with GI for further evaluation.  Return precautions if any worsening bleeding    ____________________________________________   FINAL CLINICAL IMPRESSION(S) / ED DIAGNOSES  Final diagnoses:  Rectal bleeding        Note:  This document was prepared using Dragon voice recognition software and may include unintentional dictation errors.    Jene EveryKinner, Tayllor Breitenstein, MD 05/13/18 1158

## 2018-05-13 NOTE — ED Triage Notes (Signed)
Rectal bleeding x 2 today

## 2018-05-13 NOTE — ED Notes (Signed)
Bedside with physician for rectal exam.
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
## 2018-11-29 ENCOUNTER — Encounter: Payer: Self-pay | Admitting: Emergency Medicine

## 2018-11-29 ENCOUNTER — Emergency Department
Admission: EM | Admit: 2018-11-29 | Discharge: 2018-11-29 | Disposition: A | Discharge status: Home / Self Care | Payer: Medicare (Managed Care) | Attending: Emergency Medicine | Admitting: Emergency Medicine

## 2018-11-29 DIAGNOSIS — R202 Paresthesia of skin: Secondary | ICD-10-CM | POA: Insufficient documentation

## 2018-11-29 DIAGNOSIS — Z79899 Other long term (current) drug therapy: Secondary | ICD-10-CM | POA: Insufficient documentation

## 2018-11-29 DIAGNOSIS — R200 Anesthesia of skin: Secondary | ICD-10-CM | POA: Diagnosis not present

## 2018-11-29 NOTE — ED Notes (Signed)
ACE wrap applied by EDP.

## 2018-11-29 NOTE — Discharge Instructions (Signed)
Your exam is overall reassuring for any serious nerve or muscle damage. You should follow-up with your provider for referral and further management. Take OTC ibuprofen for pain relief. Wear the ace bandage for support.

## 2018-11-29 NOTE — ED Triage Notes (Signed)
Patient presents to the ED with numbness to the 4th and 5th fingers of her left hand.  Patient denies any other numbness or weakness.  Patient states, "my mom had carpal tunnel and had to have surgery so I wanted to get checked out."  Patient states symptoms "got really bad 2 days ago." Patient is in no obvious distress at this time.  Patient is texting during triage.

## 2018-11-29 NOTE — ED Provider Notes (Signed)
[facility] Emergency Department Provider Note ____________________________________________  Time seen: 1351  I have reviewed the triage vital signs and the nursing notes.  HISTORY  Chief Complaint  Numbness (4th and 5th fingers of left hand)  HPI Jean Knapp is a 23 y.o.right-handed female presents to the ED, accompanied by her mother, for evaluation of a 2-day complaint of intermittent left finger paresthesias. She denies any recent injury, accident, trauma, or overuse. She denies grip changes or weakness. She notes tingling in the 4th & 5th fingers with holding things. She is without complaints of edema, of skin temp/color changes. She was concerned because her mother was previously diagnosed with carpal tunnel and had surgery. She has not seen anyone else for symptom relief.   Past Medical History:  Diagnosis Date  . Seasonal allergies   . Seizures [facility])     Patient Active Problem List   Diagnosis Date Noted  . Bradycardia 07/16/2015  . Early satiety 07/16/2015  . Abnormal weight loss 07/16/2015    History reviewed. No pertinent surgical history.  Prior to Admission medications   Medication Sig Start Date End Date Taking? Authorizing Provider  meloxicam (MOBIC) 15 MG tablet Take 1 tablet (15 mg total) by mouth daily. 04/09/18   Cuthriell, Delorise RoyalsJonathan D, PA-C  methocarbamol (ROBAXIN) 500 MG tablet Take 1 tablet (500 mg total) by mouth 4 (four) times daily. 04/09/18   Cuthriell, Delorise RoyalsJonathan D, PA-C    Allergies Patient has no known allergies.  No family history on file.  Social History Social History   Tobacco Use  . Smoking status: Never Smoker  . Smokeless tobacco: Never Used  Substance Use Topics  . Alcohol use: No  . Drug use: No    Review of Systems  Constitutional: Negative for fever. Cardiovascular: Negative for chest pain. Respiratory: Negative for shortness of breath. Musculoskeletal: Negative for back pain. Skin: Negative for  rash. Neurological: Negative for headaches, focal weakness. Reports left hand paresthesias & numbness. ____________________________________________  PHYSICAL EXAM:  VITAL SIGNS: ED Triage Vitals  Enc Vitals Group     BP 11/29/18 1303 126/80     Pulse Rate 11/29/18 1303 70     Resp 11/29/18 1303 20     Temp 11/29/18 1303 99.3 F (37.4 C)     Temp Source 11/29/18 1303 Oral     SpO2 11/29/18 1303 98 %     Weight 11/29/18 1304 120 lb (54.4 kg)     Height 11/29/18 1304 5\' 1"  (1.549 m)     Head Circumference --      Peak Flow --      Pain Score 11/29/18 1303 9     Pain Loc --      Pain Edu? --      Excl. in GC? --     Constitutional: Alert and oriented. Well appearing and in no distress. Head: Normocephalic and atraumatic. Eyes: Conjunctivae are normal. Normal extraocular movements Neck: Supple. Normal ROM Cardiovascular: Normal rate, regular rhythm. Normal distal pulses. Respiratory: Normal respiratory effort. No wheezes/rales/rhonchi. Musculoskeletal: Left hand without obvious deformity, dislocation, or edema.  Normal composite fist bilaterally.  Mildly positive Finkelstein on the left.  Nontender with normal range of motion in all extremities.  Neurologic: Cranial nerves II through XII grossly intact.  Normal intrinsic and opposition testing.  Normal UE ears bilaterally.  For carpal or cubital Tinel's.  Negative cubital compression test.  Normal gait without ataxia. Normal speech and language. No gross focal neurologic deficits are appreciated.  Skin:  Skin is warm, dry and intact. No rash noted.  No CCE noted distally. ____________________________________________  PROCEDURES  Procedures Ace bandage - left wrist ____________________________________________  INITIAL IMPRESSION / ASSESSMENT AND PLAN / ED COURSE  Patient with ED evaluation of a 2-day complaint of intermittent paresthesias to the left hand at the fourth and fifth digits.  Patient without any preceding injury,  accident, trauma, or reported overuse.  Symptoms likely represent a mild paresthesia without indication of significant median nerve compression.  Patient also with a mildly positive Finkelstein on exam.  Her exam is overall reassuring and benign at this time.  Suggested the patient wear an Ace bandage for support and protection.  She will be referred to a local primary care provider for routine care.  She is also given instructions to dose over-the-counter ibuprofen as needed for pain relief. ____________________________________________  FINAL CLINICAL IMPRESSION(S) / ED DIAGNOSES  Final diagnoses:  Paresthesias      Karmen Stabs, Charlesetta Ivory, PA-C 11/29/18 1409    Minna Antis, MD 11/29/18 1517

## 2019-08-04 ENCOUNTER — Other Ambulatory Visit: Payer: Self-pay

## 2019-08-04 ENCOUNTER — Emergency Department
Admission: EM | Admit: 2019-08-04 | Discharge: 2019-08-04 | Disposition: A | Discharge status: Home / Self Care | Payer: PRIVATE HEALTH INSURANCE | Attending: Emergency Medicine | Admitting: Emergency Medicine

## 2019-08-04 ENCOUNTER — Encounter: Payer: Self-pay | Admitting: Emergency Medicine

## 2019-08-04 DIAGNOSIS — S61307A Unspecified open wound of left little finger with damage to nail, initial encounter: Secondary | ICD-10-CM | POA: Insufficient documentation

## 2019-08-04 DIAGNOSIS — G40909 Epilepsy, unspecified, not intractable, without status epilepticus: Secondary | ICD-10-CM | POA: Insufficient documentation

## 2019-08-04 DIAGNOSIS — Y929 Unspecified place or not applicable: Secondary | ICD-10-CM | POA: Insufficient documentation

## 2019-08-04 DIAGNOSIS — S61309A Unspecified open wound of unspecified finger with damage to nail, initial encounter: Secondary | ICD-10-CM

## 2019-08-04 DIAGNOSIS — Y939 Activity, unspecified: Secondary | ICD-10-CM | POA: Insufficient documentation

## 2019-08-04 DIAGNOSIS — W2209XA Striking against other stationary object, initial encounter: Secondary | ICD-10-CM | POA: Insufficient documentation

## 2019-08-04 DIAGNOSIS — Y999 Unspecified external cause status: Secondary | ICD-10-CM | POA: Insufficient documentation

## 2019-08-04 MED ORDER — LIDOCAINE HCL (PF) 1 % IJ SOLN
5.0000 mL | Freq: Once | INTRAMUSCULAR | Status: AC
  Administered 2019-08-04: 14:00:00 5 mL
  Filled 2019-08-04: qty 5

## 2019-08-04 MED ORDER — NEOSPORIN PLUS PAIN RELIEF MS 3.5-10000-10 EX CREA: TOPICAL_CREAM | Freq: Two times a day (BID) | CUTANEOUS | 0 refills | Status: DC

## 2019-08-04 MED ORDER — TRAMADOL HCL 50 MG PO TABS: 50.0000 mg | ORAL_TABLET | Freq: Two times a day (BID) | ORAL | 0 refills | Status: DC | PRN

## 2019-08-04 MED ORDER — BACITRACIN-NEOMYCIN-POLYMYXIN 400-5-5000 EX OINT
TOPICAL_OINTMENT | Freq: Once | CUTANEOUS | Status: AC
  Administered 2019-08-04: 1 via TOPICAL
  Filled 2019-08-04: qty 1

## 2019-08-04 NOTE — Discharge Instructions (Addendum)
Follow discharge care instruction take medication as directed.  Advised using a glove for bathing or showering.

## 2019-08-04 NOTE — ED Triage Notes (Signed)
Bent nail back L little finger last night.

## 2019-08-04 NOTE — ED Provider Notes (Signed)
[facility] Emergency Department Provider Note   ____________________________________________   First MD Initiated Contact with Patient 08/04/19 1345     (approximate)  I have reviewed the triage vital signs and the nursing notes.   HISTORY  Chief Complaint Finger Injury    HPI Jean Knapp is a 23 y.o. female patient presents with partial nail avulsion to the fifth digit left left hand.  Incident occurred last night.  Patient denies loss sensation or loss of function.  Patient rates pain as 10/10.  Patient described the pain is "achy".  No palliative measure for complaint.         Past Medical History:  Diagnosis Date  . Seasonal allergies   . Seizures [facility])     Patient Active Problem List   Diagnosis Date Noted  . Bradycardia 07/16/2015  . Early satiety 07/16/2015  . Abnormal weight loss 07/16/2015    History reviewed. No pertinent surgical history.  Prior to Admission medications   Medication Sig Start Date End Date Taking? Authorizing Provider  meloxicam (MOBIC) 15 MG tablet Take 1 tablet (15 mg total) by mouth daily. 04/09/18   Cuthriell, Delorise Royals, PA-C  methocarbamol (ROBAXIN) 500 MG tablet Take 1 tablet (500 mg total) by mouth 4 (four) times daily. 04/09/18   Cuthriell, Delorise Royals, PA-C  neomycin-polymyxin-pramoxine (NEOSPORIN PLUS) 1 % cream Apply topically 2 (two) times daily. 08/04/19   Joni Reining, PA-C  traMADol (ULTRAM) 50 MG tablet Take 1 tablet (50 mg total) by mouth every 12 (twelve) hours as needed. 08/04/19   Joni Reining, PA-C    Allergies Patient has no known allergies.  No family history on file.  Social History Social History   Tobacco Use  . Smoking status: Never Smoker  . Smokeless tobacco: Never Used  Substance Use Topics  . Alcohol use: No  . Drug use: No    Review of Systems Constitutional: No fever/chills Eyes: No visual changes. ENT: No sore throat. Cardiovascular: Denies chest pain.  Respiratory: Denies shortness of breath. Gastrointestinal: No abdominal pain.  No nausea, no vomiting.  No diarrhea.  No constipation. Genitourinary: Negative for dysuria. Musculoskeletal: Negative for back pain. Skin: Negative for rash.  Partial finger nail avulsion left fifth digit   ____________________________________________   PHYSICAL EXAM:  VITAL SIGNS: ED Triage Vitals  Enc Vitals Group     BP 08/04/19 1341 (!) 138/97     Pulse Rate 08/04/19 1341 72     Resp 08/04/19 1341 18     Temp 08/04/19 1341 98.9 F (37.2 C)     Temp Source 08/04/19 1341 Oral     SpO2 08/04/19 1341 99 %     Weight 08/04/19 1342 124 lb (56.2 kg)     Height 08/04/19 1342 5\' 1"  (1.549 m)     Head Circumference --      Peak Flow --      Pain Score 08/04/19 1342 10     Pain Loc --      Pain Edu? --      Excl. in [place]? --    Constitutional: Alert and oriented. Well appearing and in no acute distress. Cardiovascular: Normal rate, regular rhythm. Grossly normal heart sounds.  Good peripheral circulation. Respiratory: Normal respiratory effort.  No retractions. Lungs CTAB. Neurologic:  Normal speech and language. No gross focal neurologic deficits are appreciated. No gait instability. Skin:  Skin is warm, dry and intact. No rash noted.  Partial nail avulsion fifth digit  left hand. Psychiatric: Mood and affect are normal. Speech and behavior are normal.  ____________________________________________   LABS (all labs ordered are listed, but only abnormal results are displayed)  Labs Reviewed - No data to display ____________________________________________  EKG   ____________________________________________  RADIOLOGY  ED MD interpretation:    Official radiology report(s): No results found.  ____________________________________________   PROCEDURES  Procedure(s) performed (including Critical Care):  .Nail Removal  Date/Time: 08/04/2019 2:00 PM Performed by: Sable Feil, PA-C  Authorized by: Sable Feil, PA-C   Consent:    Consent obtained:  Verbal   Consent given by:  Patient   Risks discussed:  Bleeding, infection and pain Location:    Hand:  L small finger Pre-procedure details:    Skin preparation:  Betadine   Preparation: Patient was prepped and draped in the usual sterile fashion   Anesthesia (see MAR for exact dosages):    Anesthesia method:  Nerve block   Block needle gauge:  25 G   Block anesthetic:  Lidocaine 1% w/o epi   Block injection procedure:  Anatomic landmarks identified   Block outcome:  Anesthesia achieved Nail Removal:    Nail removed:  Complete   Nail bed repaired: no       ____________________________________________   INITIAL IMPRESSION / ASSESSMENT AND PLAN / ED COURSE  As part of my medical decision making, I reviewed the following data within the [place] was evaluated in Emergency Department on 08/04/2019 for the symptoms described in the history of present illness. She was evaluated in the context of the global COVID-19 pandemic, which necessitated consideration that the patient might be at risk for infection with the SARS-CoV-2 virus that causes COVID-19. Institutional protocols and algorithms that pertain to the evaluation of patients at risk for COVID-19 are in a state of rapid change based on information released by regulatory bodies including the CDC and federal and state organizations. These policies and algorithms were followed during the patient's care in the ED.  Patient presents with partial nail avulsion fifth digit left hand.  See procedure note.  Patient given discharge care instructions.  Return to ED if condition worsens.      ____________________________________________   FINAL CLINICAL IMPRESSION(S) / ED DIAGNOSES  Final diagnoses:  Nail avulsion, finger, initial encounter     ED Discharge Orders         Ordered     neomycin-polymyxin-pramoxine (NEOSPORIN PLUS) 1 % cream  2 times daily     08/04/19 1409    traMADol (ULTRAM) 50 MG tablet  Every 12 hours PRN     08/04/19 1409           Note:  This document was prepared using Dragon voice recognition software and may include unintentional dictation errors.    Sable Feil, PA-C 08/04/19 1414    Vanessa [place], MD 08/05/19 [phone]

## 2019-08-04 NOTE — ED Notes (Signed)
Pt c/o pain in her left 5th finger. Pt states she hit her artificial nail on a door knob last night and now the real nail bed is lifted.

## 2019-09-27 ENCOUNTER — Other Ambulatory Visit: Payer: Self-pay

## 2019-09-27 DIAGNOSIS — Z20822 Contact with and (suspected) exposure to covid-19: Secondary | ICD-10-CM

## 2019-09-29 LAB — NOVEL CORONAVIRUS, NAA: SARS-CoV-2, NAA: NOT DETECTED

## 2019-10-05 ENCOUNTER — Emergency Department
Admission: EM | Admit: 2019-10-05 | Discharge: 2019-10-05 | Disposition: A | Discharge status: Home / Self Care | Payer: Medicaid Other | Attending: Emergency Medicine | Admitting: Emergency Medicine

## 2019-10-05 ENCOUNTER — Other Ambulatory Visit: Payer: Self-pay

## 2019-10-05 DIAGNOSIS — Z79899 Other long term (current) drug therapy: Secondary | ICD-10-CM | POA: Insufficient documentation

## 2019-10-05 DIAGNOSIS — N939 Abnormal uterine and vaginal bleeding, unspecified: Secondary | ICD-10-CM | POA: Insufficient documentation

## 2019-10-05 DIAGNOSIS — N946 Dysmenorrhea, unspecified: Secondary | ICD-10-CM | POA: Insufficient documentation

## 2019-10-05 LAB — POCT PREGNANCY, URINE: Preg Test, Ur: NEGATIVE

## 2019-10-05 NOTE — ED Provider Notes (Signed)
[facility] Emergency Department Provider Note  ____________________________________________  Time seen: Approximately 3:29 PM  I have reviewed the triage vital signs and the nursing notes.   HISTORY  Chief Complaint Menstrual Cramps and Vaginal Bleeding   HPI Jean Knapp is a 23 y.o. female presents to the emergency department for evaluation of menstrual cramping.  Patient states that she does not typically have a menstrual cycle since she is on Depo.  Her last Depo injection was October.  She states that her vaginal bleeding has been light.  She has tried some Tylenol without relief.  Past Medical History:  Diagnosis Date  . Seasonal allergies   . Seizures [facility])     Patient Active Problem List   Diagnosis Date Noted  . Bradycardia 07/16/2015  . Early satiety 07/16/2015  . Abnormal weight loss 07/16/2015    No past surgical history on file.  Prior to Admission medications   Medication Sig Start Date End Date Taking? Authorizing Provider  meloxicam (MOBIC) 15 MG tablet Take 1 tablet (15 mg total) by mouth daily. 04/09/18   Cuthriell, Charline Bills, PA-C  methocarbamol (ROBAXIN) 500 MG tablet Take 1 tablet (500 mg total) by mouth 4 (four) times daily. 04/09/18   Cuthriell, Charline Bills, PA-C  neomycin-polymyxin-pramoxine (NEOSPORIN PLUS) 1 % cream Apply topically 2 (two) times daily. 08/04/19   Sable Feil, PA-C  traMADol (ULTRAM) 50 MG tablet Take 1 tablet (50 mg total) by mouth every 12 (twelve) hours as needed. 08/04/19   Sable Feil, PA-C    Allergies Patient has no known allergies.  No family history on file.  Social History Social History   Tobacco Use  . Smoking status: Never Smoker  . Smokeless tobacco: Never Used  Substance Use Topics  . Alcohol use: No  . Drug use: No    Review of Systems Constitutional: Negative for fever. ENT: Negative for sore throat. Respiratory: Negative for cough or shortness of  breath Gastrointestinal: Positive for diffuse, generalized pelvic cramping. no nausea, no vomiting.  No diarrhea.  Musculoskeletal: Negative for generalized body aches. Skin: Negative for rash/lesion/wound. Neurological: Negative for headaches, focal weakness or numbness.  ____________________________________________   PHYSICAL EXAM:  VITAL SIGNS: ED Triage Vitals [10/05/19 1400]  Enc Vitals Group     BP 138/86     Pulse Rate 69     Resp 18     Temp 99.2 F (37.3 C)     Temp Source Oral     SpO2 98 %     Weight 125 lb (56.7 kg)     Height 5\' 1"  (1.549 m)     Head Circumference      Peak Flow      Pain Score 8     Pain Loc      Pain Edu?      Excl. in [place]?     Constitutional: Alert and oriented. Well appearing and in no acute distress. Eyes: Conjunctivae are normal. PERRL. EOMI. Head: Atraumatic. Nose: No congestion/rhinnorhea. Mouth/Throat: Mucous membranes are moist. Neck: No stridor.  Cardiovascular: Normal rate, regular rhythm. Good peripheral circulation. Respiratory: Normal respiratory effort. Musculoskeletal: Full ROM throughout.  Neurologic:  Normal speech and language. No gross focal neurologic deficits are appreciated. Speech is normal. No gait instability. Skin:  Skin is warm, dry and intact. No rash noted. Psychiatric: Mood and affect are normal. Speech and behavior are normal.  ____________________________________________   LABS (all labs ordered are listed, but only abnormal results are  displayed)  Labs Reviewed  POCT PREGNANCY, URINE   ____________________________________________  EKG  Not indicated ____________________________________________  RADIOLOGY  Not indicated ____________________________________________   PROCEDURES  None ____________________________________________   INITIAL IMPRESSION / ASSESSMENT AND PLAN / ED COURSE     Patient declined pelvic exam and states "I am just ready to go home."  Patient was advised to  take Naprosyn twice per day.  She was also advised to follow-up with her primary care provider or gynecologist for symptoms that are not improving over the next week or so.  She was encouraged to return to the emergency department for symptoms change or worsen if unable to schedule appointment.. ____________________________________________   FINAL CLINICAL IMPRESSION(S) / ED DIAGNOSES  Final diagnoses:  Dysmenorrhea       Chinita Pester, FNP 10/05/19 1717    Dionne Bucy, MD 10/05/19 2229

## 2019-10-05 NOTE — ED Triage Notes (Signed)
Pt presents to ED via POV with c/o lower abdominal cramping at this time. Pt states has been on Depo "for like a minute". Pt states has been having spotting and lower abdominal cramping x several days. Pt states "my period cramps have never been this bad". Pt playing on phone in triage at this time visualized in NAD.

## 2019-10-05 NOTE — Discharge Instructions (Signed)
Take naprosyn 2 times per day. Follow up with primary care or the gynecologist for symptoms of concern. Return to the ER for urgent concerns if you are unable to see primary care.

## 2019-10-31 ENCOUNTER — Ambulatory Visit: Payer: Medicaid Other | Attending: Internal Medicine

## 2019-10-31 DIAGNOSIS — Z20822 Contact with and (suspected) exposure to covid-19: Secondary | ICD-10-CM

## 2019-11-01 LAB — NOVEL CORONAVIRUS, NAA: SARS-CoV-2, NAA: NOT DETECTED

## 2020-02-12 ENCOUNTER — Emergency Department
Admission: EM | Admit: 2020-02-12 | Discharge: 2020-02-12 | Disposition: A | Discharge status: Home / Self Care | Payer: Medicaid Other | Attending: Emergency Medicine | Admitting: Emergency Medicine

## 2020-02-12 ENCOUNTER — Other Ambulatory Visit: Payer: Self-pay

## 2020-02-12 DIAGNOSIS — J302 Other seasonal allergic rhinitis: Secondary | ICD-10-CM | POA: Insufficient documentation

## 2020-02-12 DIAGNOSIS — Z79899 Other long term (current) drug therapy: Secondary | ICD-10-CM | POA: Insufficient documentation

## 2020-02-12 MED ORDER — FLUTICASONE PROPIONATE 50 MCG/ACT NA SUSP: 2.0000 | Freq: Every day | NASAL | 1 refills | Status: AC

## 2020-02-12 MED ORDER — CETIRIZINE HCL 10 MG PO TABS: 10.0000 mg | ORAL_TABLET | Freq: Every day | ORAL | 1 refills | Status: AC

## 2020-02-12 NOTE — ED Triage Notes (Signed)
Sinus pressure, congestion and cough X 1 week. Worse at night.

## 2020-02-12 NOTE — ED Provider Notes (Signed)
[facility] Emergency Department Provider Note   ____________________________________________   First MD Initiated Contact with Patient 02/12/20 1253     (approximate)  I have reviewed the triage vital signs and the nursing notes.   HISTORY  Chief Complaint Facial Pain and Cough   HPI Jean Knapp is a 24 y.o. female presents to the ED with complaint of sinus pressure, sneezing and cough.  Patient states she has history of allergies and at one time was seeing an allergy specialist at [facility].  Patient has been using a nose spray that her mother obtained for her.  She states it is not working.  She denies any fever, chills, nausea or vomiting.     Past Medical History:  Diagnosis Date  . Seasonal allergies   . Seizures [facility])     Patient Active Problem List   Diagnosis Date Noted  . Bradycardia 07/16/2015  . Early satiety 07/16/2015  . Abnormal weight loss 07/16/2015    History reviewed. No pertinent surgical history.  Prior to Admission medications   Medication Sig Start Date End Date Taking? Authorizing Provider  cetirizine (ZYRTEC ALLERGY) 10 MG tablet Take 1 tablet (10 mg total) by mouth daily. 02/12/20 03/13/20  Johnn Hai, PA-C  fluticasone (FLONASE) 50 MCG/ACT nasal spray Place 2 sprays into both nostrils daily. 02/12/20 02/11/21  Johnn Hai, PA-C  neomycin-polymyxin-pramoxine (NEOSPORIN PLUS) 1 % cream Apply topically 2 (two) times daily. 08/04/19   Sable Feil, PA-C    Allergies Patient has no known allergies.  No family history on file.  Social History Social History   Tobacco Use  . Smoking status: Never Smoker  . Smokeless tobacco: Never Used  Substance Use Topics  . Alcohol use: No  . Drug use: No    Review of Systems Constitutional: No fever/chills Eyes: No visual changes. ENT: Nasal congestion, sneezing. Cardiovascular: Denies chest pain. Respiratory: Denies shortness of breath.  Positive  cough. Musculoskeletal: Negative for back pain. Skin: Negative for rash. Neurological: Negative for headaches, focal weakness or numbness. ____________________________________________   PHYSICAL EXAM:  VITAL SIGNS: ED Triage Vitals  Enc Vitals Group     BP 02/12/20 1207 (!) 134/94     Pulse Rate 02/12/20 1207 87     Resp 02/12/20 1207 16     Temp 02/12/20 1207 98.4 F (36.9 C)     Temp Source 02/12/20 1207 Oral     SpO2 02/12/20 1207 100 %     Weight 02/12/20 1208 129 lb (58.5 kg)     Height 02/12/20 1208 5\' 1"  (1.549 m)     Head Circumference --      Peak Flow --      Pain Score 02/12/20 1208 0     Pain Loc --      Pain Edu? --      Excl. in [place]? --     Constitutional: Alert and oriented. Well appearing and in no acute distress. Eyes: Conjunctivae are normal. PERRL. EOMI. Head: Atraumatic. Nose: Minimal congestion/no rhinnorhea. Mouth/Throat: Mucous membranes are moist.  Oropharynx non-erythematous. Neck: No stridor.   Cardiovascular: Normal rate, regular rhythm. Grossly normal heart sounds.  Good peripheral circulation. Respiratory: Normal respiratory effort.  No retractions. Lungs CTAB. Musculoskeletal: Moves upper and lower extremities without any difficulty normal gait was noted.   Neurologic:  Normal speech and language. No gross focal neurologic deficits are appreciated. No gait instability. Skin:  Skin is warm, dry and intact. No rash noted. Psychiatric:  Mood and affect are normal. Speech and behavior are normal.  ____________________________________________   LABS (all labs ordered are listed, but only abnormal results are displayed)  Labs Reviewed - No data to display   PROCEDURES  Procedure(s) performed (including Critical Care):  Procedures   ____________________________________________   INITIAL IMPRESSION / ASSESSMENT AND PLAN / ED COURSE  As part of my medical decision making, I reviewed the following data within the electronic medical  record:  Notes from prior ED visits and [place] Controlled Substance Database  24 year old female presents to the ED with complaint of nasal congestion, sneezing, cough especially at night.  Patient has a history of allergies and was seeing an allergy specialist at [facility].  Patient ran out of medication and states that the over-the-counter medication that her mother purchased for her is not working.  Review of her records from [facility] shows that she was on Zyrtec and Flonase nasal spray.  Patient is agreeable to go back on this medication and prescriptions were sent to her pharmacy.  ____________________________________________   FINAL CLINICAL IMPRESSION(S) / ED DIAGNOSES  Final diagnoses:  Seasonal allergies     ED Discharge Orders         Ordered    fluticasone (FLONASE) 50 MCG/ACT nasal spray  Daily     02/12/20 1351    cetirizine (ZYRTEC ALLERGY) 10 MG tablet  Daily     02/12/20 1351           Note:  This document was prepared using Dragon voice recognition software and may include unintentional dictation errors.    Tommi Rumps, PA-C 02/12/20 1429    Emily Filbert, MD 02/12/20 1524

## 2020-02-12 NOTE — ED Notes (Signed)
See triage note   Presents with sinus pressure and cough  States she thinks it may be her allergies  Denies any fever

## 2020-02-12 NOTE — Discharge Instructions (Signed)
Follow-up with your allergy specialist at [facility] if any continued problems.  Begin taking medication as directed.

## 2020-04-15 ENCOUNTER — Other Ambulatory Visit: Payer: Self-pay

## 2020-04-23 ENCOUNTER — Ambulatory Visit: Payer: Self-pay | Admitting: Adult Health

## 2020-07-30 ENCOUNTER — Emergency Department
Admission: EM | Admit: 2020-07-30 | Discharge: 2020-07-30 | Discharge status: Absent without leave | Payer: Medicaid Other

## 2021-10-28 ENCOUNTER — Emergency Department
Admission: EM | Admit: 2021-10-28 | Discharge: 2021-10-28 | Disposition: A | Discharge status: Home / Self Care | Payer: BC Managed Care – PPO | Attending: Student in an Organized Health Care Education/Training Program | Admitting: Student in an Organized Health Care Education/Training Program

## 2021-10-28 ENCOUNTER — Other Ambulatory Visit: Payer: Self-pay

## 2021-10-28 ENCOUNTER — Encounter: Payer: Self-pay | Admitting: Emergency Medicine

## 2021-10-28 DIAGNOSIS — M549 Dorsalgia, unspecified: Secondary | ICD-10-CM | POA: Diagnosis present

## 2021-10-28 DIAGNOSIS — Z0000 Encounter for general adult medical examination without abnormal findings: Secondary | ICD-10-CM

## 2021-10-28 DIAGNOSIS — R1030 Lower abdominal pain, unspecified: Secondary | ICD-10-CM | POA: Insufficient documentation

## 2021-10-28 DIAGNOSIS — R110 Nausea: Secondary | ICD-10-CM | POA: Insufficient documentation

## 2021-10-28 LAB — URINALYSIS, COMPLETE (UACMP) WITH MICROSCOPIC
Bilirubin Urine: NEGATIVE
Glucose, UA: NEGATIVE mg/dL
Hgb urine dipstick: NEGATIVE
Ketones, ur: NEGATIVE mg/dL
Nitrite: NEGATIVE
Protein, ur: NEGATIVE mg/dL
Specific Gravity, Urine: >1.03 — ABNORMAL HIGH (ref 1.005–1.030)
pH: 5.5 (ref 5.0–8.0)

## 2021-10-28 LAB — POC URINE PREG, ED: Preg Test, Ur: NEGATIVE

## 2021-10-28 MED ORDER — NITROFURANTOIN MONOHYD MACRO 100 MG PO CAPS: 100.0000 mg | ORAL_CAPSULE | Freq: Two times a day (BID) | ORAL | 0 refills | Status: AC

## 2021-10-28 NOTE — ED Triage Notes (Signed)
Pt to ED via POV c/o lower back pain and lower abdominal cramping. Pt states that her symptoms started on Saturday. Pt denies vaginal bleeding. Pt LMP 09/29/21. Pt is in NAD.

## 2021-10-28 NOTE — ED Provider Notes (Signed)
[facility] Emergency Department Provider Note    Event Date/Time   First MD Initiated Contact with Patient 10/28/21 1040     (approximate)  I have reviewed the triage vital signs and the nursing notes.   HISTORY  Chief Complaint Back Pain and Abdominal Cramping    HPI Jean Knapp is a 25 y.o. female no significant past medical history presents to the ER for evaluation of episode of lower abdominal pain some back pain and nausea.  Her primary concern is that she is worried that she may be pregnant.  Last menstrual cycle was the end last month.  States it is only 3 days.  States that her sister had similar symptoms and found out that she is pregnant.  She denies any abdominal pain at this time.  No nausea or vomiting no back pain at this time.  States these episodes have been intermittent over the past few days to weeks she is worried also may be related to anxiety.  Denies any fevers.  No dysuria.  No vaginal discharge.   Past Medical History:  Diagnosis Date   Seasonal allergies    Seizures (HCC)    No family history on file. History reviewed. No pertinent surgical history. Patient Active Problem List   Diagnosis Date Noted   Bradycardia 07/16/2015   Early satiety 07/16/2015   Abnormal weight loss 07/16/2015      Prior to Admission medications   Medication Sig Start Date End Date Taking? Authorizing Provider  nitrofurantoin, macrocrystal-monohydrate, (MACROBID) 100 MG capsule Take 1 capsule (100 mg total) by mouth 2 (two) times daily for 3 days. 10/28/21 10/31/21 Yes Willy Eddy, MD  cetirizine (ZYRTEC ALLERGY) 10 MG tablet Take 1 tablet (10 mg total) by mouth daily. 02/12/20 03/13/20  Tommi Rumps, PA-C  fluticasone (FLONASE) 50 MCG/ACT nasal spray Place 2 sprays into both nostrils daily. 02/12/20 02/11/21  Tommi Rumps, PA-C  neomycin-polymyxin-pramoxine (NEOSPORIN PLUS) 1 % cream Apply topically 2 (two) times daily. 08/04/19    Joni Reining, PA-C    Allergies Patient has no known allergies.    Social History Social History   Tobacco Use   Smoking status: Never   Smokeless tobacco: Never  Substance Use Topics   Alcohol use: No   Drug use: No    Review of Systems Patient denies headaches, rhinorrhea, blurry vision, numbness, shortness of breath, chest pain, edema, cough, abdominal pain, nausea, vomiting, diarrhea, dysuria, fevers, rashes or hallucinations unless otherwise stated above in HPI. ____________________________________________   PHYSICAL EXAM:  VITAL SIGNS: Vitals:   10/28/21 0912  BP: (!) 150/111  Pulse: 95  Resp: 16  Temp: 98.4 F (36.9 C)  SpO2: 97%    Constitutional: Alert and oriented. Well appearing and in no acute distress. Eyes: Conjunctivae are normal.  Head: Atraumatic. Nose: No congestion/rhinnorhea. Mouth/Throat: Mucous membranes are moist.   Neck: Painless ROM.  Cardiovascular:   Good peripheral circulation. Respiratory: Normal respiratory effort.  No retractions.  Gastrointestinal: Soft and nontender.  Musculoskeletal: No lower extremity tenderness .  No joint effusions. Neurologic:  Normal speech and language. No gross focal neurologic deficits are appreciated.  Skin:  Skin is warm, dry and intact. No rash noted. Psychiatric: Mood and affect are normal. Speech and behavior are normal.  ____________________________________________   LABS (all labs ordered are listed, but only abnormal results are displayed)  Results for orders placed or performed during the hospital encounter of 10/28/21 (from the past 24 hour(s))  Urinalysis, Complete w Microscopic     Status: Abnormal   Collection Time: 10/28/21 10:25 AM  Result Value Ref Range   Color, Urine YELLOW YELLOW   APPearance CLEAR (A) CLEAR   Specific Gravity, Urine >1.030 (H) 1.005 - 1.030   pH 5.5 5.0 - 8.0   Glucose, UA NEGATIVE NEGATIVE mg/dL   Hgb urine dipstick NEGATIVE NEGATIVE   Bilirubin Urine  NEGATIVE NEGATIVE   Ketones, ur NEGATIVE NEGATIVE mg/dL   Protein, ur NEGATIVE NEGATIVE mg/dL   Nitrite NEGATIVE NEGATIVE   Leukocytes,Ua TRACE (A) NEGATIVE   RBC / HPF 0-5 0 - 5 RBC/hpf   WBC, UA 11-20 0 - 5 WBC/hpf   Bacteria, UA MANY (A) NONE SEEN   Squamous Epithelial / LPF 11-20 0 - 5   Mucus PRESENT   POC Urine Pregnancy, ED     Status: None   Collection Time: 10/28/21 10:57 AM  Result Value Ref Range   Preg Test, Ur NEGATIVE NEGATIVE   ____________________________________________ ____________________________________________  RADIOLOGY   ____________________________________________   PROCEDURES  Procedure(s) performed:  Procedures    Critical Care performed: no ____________________________________________   INITIAL IMPRESSION / ASSESSMENT AND PLAN / ED COURSE  Pertinent labs & imaging results that were available during my care of the patient were reviewed by me and considered in my medical decision making (see chart for details).   DDX: uti, pregnancy, colitis, appendicitis, cyst, toa, torsion  Jean Knapp is a 25 y.o. who presents to the ED with presentation as described above.  She is well-appearing hemodynamically stable and asymptomatic at this time.  I have a lower suspicion for appendicitis colitis, TOA or torsion.  Her primary concern is that she could be pregnant and took a home pregnancy test which was negative but wanted to verify.  U pregnancy test is here.  Does have many bacteria on her urinalysis.  She is denying any discharge.  Will cover with antibiotics.  Does not seem consistent with pyelonephritis do not feel that further blood work or diagnostic imaging clinically indicated given her well appearance.  Discussed strict return precautions      ____________________________________________   FINAL CLINICAL IMPRESSION(S) / ED DIAGNOSES  Final diagnoses:  Well adult health check      NEW MEDICATIONS STARTED DURING THIS  VISIT:  New Prescriptions   NITROFURANTOIN, MACROCRYSTAL-MONOHYDRATE, (MACROBID) 100 MG CAPSULE    Take 1 capsule (100 mg total) by mouth 2 (two) times daily for 3 days.     Note:  This document was prepared using Dragon voice recognition software and may include unintentional dictation errors.     Merlyn Lot, MD 10/28/21 [phone]

## 2021-10-28 NOTE — Discharge Instructions (Addendum)

## 2021-10-28 NOTE — ED Notes (Signed)
Patient declined discharge vital signs. 

## 2022-03-30 ENCOUNTER — Ambulatory Visit (LOCAL_COMMUNITY_HEALTH_CENTER): Payer: Self-pay

## 2022-03-30 DIAGNOSIS — Z111 Encounter for screening for respiratory tuberculosis: Secondary | ICD-10-CM

## 2022-04-02 ENCOUNTER — Ambulatory Visit (LOCAL_COMMUNITY_HEALTH_CENTER): Payer: Self-pay

## 2022-04-02 DIAGNOSIS — Z111 Encounter for screening for respiratory tuberculosis: Secondary | ICD-10-CM

## 2022-04-02 LAB — TB SKIN TEST
Induration: 0 mm
TB Skin Test: NEGATIVE

## 2022-12-22 ENCOUNTER — Ambulatory Visit
Admission: RE | Admit: 2022-12-22 | Discharge: 2022-12-22 | Disposition: A | Discharge status: Home / Self Care | Payer: BC Managed Care – PPO | Source: Ambulatory Visit | Attending: Emergency Medicine | Admitting: Emergency Medicine

## 2022-12-22 VITALS — BP 130/79 | HR 75 | Temp 98.9°F | Resp 18

## 2022-12-22 DIAGNOSIS — Z1152 Encounter for screening for COVID-19: Secondary | ICD-10-CM | POA: Diagnosis not present

## 2022-12-22 DIAGNOSIS — J029 Acute pharyngitis, unspecified: Secondary | ICD-10-CM | POA: Insufficient documentation

## 2022-12-22 LAB — POCT RAPID STREP A (OFFICE): Rapid Strep A Screen: NEGATIVE

## 2022-12-22 NOTE — Discharge Instructions (Addendum)
Your rapid strep test is negative.  A throat culture is pending; we will call you if it is positive requiring treatment.    Your COVID test is pending.    Take Tylenol as needed for fever or discomfort.  Rest and keep yourself hydrated.    Follow-up with your primary care provider if your symptoms are not improving.

## 2022-12-22 NOTE — ED Triage Notes (Signed)
Patient to Urgent Care with complaints of sore throat. Denies any known fevers.   Symptoms started Monday. Reports painful swallowing/ developed a dry cough.

## 2022-12-22 NOTE — ED Provider Notes (Signed)
Roderic Palau    CSN: [id number] Arrival date & time: 12/22/22  1158      History   Chief Complaint Chief Complaint  Patient presents with   Sore Throat    Entered by patient    HPI Jean Knapp is a 27 y.o. female.  Patient presents with 2 day history of sore throat and mild nonproductive cough.  No fever, rash, shortness of breath, or other symptoms.  No OTC medications taken today; took Theraflu yesterday.  Her medical history includes seizures.  She denies current pregnancy or breastfeeding.     The history is provided by the patient and medical records.    Past Medical History:  Diagnosis Date   Seasonal allergies    Seizures [facility])     Patient Active Problem List   Diagnosis Date Noted   Bradycardia 07/16/2015   Early satiety 07/16/2015   Abnormal weight loss 07/16/2015    History reviewed. No pertinent surgical history.  OB History   No obstetric history on file.      Home Medications    Prior to Admission medications   Medication Sig Start Date End Date Taking? Authorizing Provider  cetirizine (ZYRTEC ALLERGY) 10 MG tablet Take 1 tablet (10 mg total) by mouth daily. 02/12/20 03/13/20  Johnn Hai, PA-C  fluticasone (FLONASE) 50 MCG/ACT nasal spray Place 2 sprays into both nostrils daily. 02/12/20 02/11/21  Johnn Hai, PA-C  neomycin-polymyxin-pramoxine (NEOSPORIN PLUS) 1 % cream Apply topically 2 (two) times daily. Patient not taking: Reported on 12/22/2022 08/04/19   Sable Feil, PA-C    Family History History reviewed. No pertinent family history.  Social History Social History   Tobacco Use   Smoking status: Never   Smokeless tobacco: Never  Substance Use Topics   Alcohol use: No   Drug use: No     Allergies   Patient has no known allergies.   Review of Systems Review of Systems  Constitutional:  Negative for chills and fever.  HENT:  Positive for sore throat. Negative for ear pain.   Respiratory:  Positive  for cough. Negative for shortness of breath.   Cardiovascular:  Negative for chest pain and palpitations.  Gastrointestinal:  Negative for diarrhea and vomiting.  Skin:  Negative for color change and rash.  All other systems reviewed and are negative.    Physical Exam Triage Vital Signs ED Triage Vitals  Enc Vitals Group     BP 12/22/22 1218 130/79     Pulse Rate 12/22/22 1218 75     Resp 12/22/22 1218 18     Temp 12/22/22 1218 98.9 F (37.2 C)     Temp src --      SpO2 12/22/22 1218 97 %     Weight --      Height --      Head Circumference --      Peak Flow --      Pain Score 12/22/22 1225 7     Pain Loc --      Pain Edu? --      Excl. in [place]? --    No data found.  Updated Vital Signs BP 130/79   Pulse 75   Temp 98.9 F (37.2 C)   Resp 18   LMP 12/15/2022   SpO2 97%   Visual Acuity Right Eye Distance:   Left Eye Distance:   Bilateral Distance:    Right Eye Near:   Left Eye Near:  Bilateral Near:     Physical Exam Vitals and nursing note reviewed.  Constitutional:      General: She is not in acute distress.    Appearance: Normal appearance. She is well-developed. She is not ill-appearing.  HENT:     Right Ear: Tympanic membrane normal.     Left Ear: Tympanic membrane normal.     Nose: Nose normal.     Mouth/Throat:     Mouth: Mucous membranes are moist.     Pharynx: Posterior oropharyngeal erythema present.  Cardiovascular:     Rate and Rhythm: Normal rate and regular rhythm.     Heart sounds: Normal heart sounds.  Pulmonary:     Effort: Pulmonary effort is normal. No respiratory distress.     Breath sounds: Normal breath sounds.  Musculoskeletal:     Cervical back: Neck supple.  Skin:    General: Skin is warm and dry.  Neurological:     Mental Status: She is alert.  Psychiatric:        Mood and Affect: Mood normal.        Behavior: Behavior normal.      UC Treatments / Results  Labs (all labs ordered are listed, but only abnormal  results are displayed) Labs Reviewed  CULTURE, GROUP A STREP ([place])  SARS CORONAVIRUS 2 (TAT 6-24 HRS)  POCT RAPID STREP A (OFFICE)    EKG   Radiology No results found.  Procedures Procedures (including critical care time)  Medications Ordered in UC Medications - No data to display  Initial Impression / Assessment and Plan / UC Course  I have reviewed the triage vital signs and the nursing notes.  Pertinent labs & imaging results that were available during my care of the patient were reviewed by me and considered in my medical decision making (see chart for details).    Acute pharyngitis.  Rapid strep negative; culture pending.  COVID pending.  Discussed symptomatic treatment including Tylenol or ibuprofen, rest, hydration.  Instructed patient to follow up with her PCP if symptoms are not improving.  She agrees to plan of care.   Final Clinical Impressions(s) / UC Diagnoses   Final diagnoses:  Acute pharyngitis, unspecified etiology     Discharge Instructions      Your rapid strep test is negative.  A throat culture is pending; we will call you if it is positive requiring treatment.    Your COVID test is pending.    Take Tylenol as needed for fever or discomfort.  Rest and keep yourself hydrated.    Follow-up with your primary care provider if your symptoms are not improving.         ED Prescriptions   None    PDMP not reviewed this encounter.   Sharion Balloon, NP 12/22/22 1242

## 2022-12-23 LAB — SARS CORONAVIRUS 2 (TAT 6-24 HRS): SARS Coronavirus 2: NEGATIVE

## 2022-12-25 LAB — CULTURE, GROUP A STREP (THRC)

## 2022-12-27 ENCOUNTER — Ambulatory Visit
Admission: RE | Admit: 2022-12-27 | Discharge: 2022-12-27 | Disposition: A | Discharge status: Home / Self Care | Payer: BC Managed Care – PPO | Source: Ambulatory Visit | Attending: Emergency Medicine | Admitting: Emergency Medicine

## 2022-12-27 VITALS — BP 122/94 | HR 85 | Temp 98.6°F

## 2022-12-27 DIAGNOSIS — J069 Acute upper respiratory infection, unspecified: Secondary | ICD-10-CM | POA: Diagnosis not present

## 2022-12-27 LAB — GROUP A STREP BY PCR: Group A Strep by PCR: NOT DETECTED

## 2022-12-27 LAB — SARS CORONAVIRUS 2 BY RT PCR: SARS Coronavirus 2 by RT PCR: NEGATIVE

## 2022-12-27 MED ORDER — PROMETHAZINE-DM 6.25-15 MG/5ML PO SYRP: 5.0000 mL | ORAL_SOLUTION | Freq: Four times a day (QID) | ORAL | 0 refills | Status: DC | PRN

## 2022-12-27 MED ORDER — IPRATROPIUM BROMIDE 0.06 % NA SOLN: 2.0000 | Freq: Four times a day (QID) | NASAL | 12 refills | Status: DC

## 2022-12-27 MED ORDER — BENZONATATE 100 MG PO CAPS: 200.0000 mg | ORAL_CAPSULE | Freq: Three times a day (TID) | ORAL | 0 refills | Status: DC

## 2022-12-27 NOTE — Discharge Instructions (Addendum)
Your testing today was negative for COVID and strep.  You have a viral respiratory infection.  This will take time to resolve.  Use over-the-counter Tylenol and/or ibuprofen according to package instructions as needed for fever or pain.  Use the Atrovent nasal spray, 2 squirts in each nostril every 6 hours, as needed for runny nose and postnasal drip.  Use the Tessalon Perles every 8 hours during the day.  Take them with a small sip of water.  They may give you some numbness to the base of your tongue or a metallic taste in your mouth, this is normal.  Use the Promethazine DM cough syrup at bedtime for cough and congestion.  It will make you drowsy so do not take it during the day.  Return for reevaluation or see your primary care provider for any new or worsening symptoms.

## 2022-12-27 NOTE — ED Triage Notes (Signed)
Pt c/o sore throat since last Monday. Pt states she was seen at [facility] (negative testing) pt states sore throat not better, denies ant fevers

## 2022-12-27 NOTE — ED Provider Notes (Signed)
[facility]    CSN: [id number] Arrival date & time: 12/27/22  1344      History   Chief Complaint Chief Complaint  Patient presents with   Sore Throat    HPI Jean Knapp is a 27 y.o. female.   HPI  27 year old female here for evaluation of sore throat.  The patient reports that she has been experiencing a sore throat for a week.  She was seen at [facility] urgent care 5 days ago and had negative rapid strep and negative COVID test.  She is here today because she continues have a sore throat and then yesterday developed headache, nasal congestion, and a nonproductive cough.  She has been afebrile, denies ear pain, and denies shortness of breath or wheezing.  Past Medical History:  Diagnosis Date   Seasonal allergies    Seizures [facility])     Patient Active Problem List   Diagnosis Date Noted   Bradycardia 07/16/2015   Early satiety 07/16/2015   Abnormal weight loss 07/16/2015    History reviewed. No pertinent surgical history.  OB History   No obstetric history on file.      Home Medications    Prior to Admission medications   Medication Sig Start Date End Date Taking? Authorizing Provider  benzonatate (TESSALON) 100 MG capsule Take 2 capsules (200 mg total) by mouth every 8 (eight) hours. 12/27/22  Yes Margarette Canada, NP  ipratropium (ATROVENT) 0.06 % nasal spray Place 2 sprays into both nostrils 4 (four) times daily. 12/27/22  Yes Margarette Canada, NP  promethazine-dextromethorphan (PROMETHAZINE-DM) 6.25-15 MG/5ML syrup Take 5 mLs by mouth 4 (four) times daily as needed. 12/27/22  Yes Margarette Canada, NP  cetirizine (ZYRTEC ALLERGY) 10 MG tablet Take 1 tablet (10 mg total) by mouth daily. 02/12/20 03/13/20  Johnn Hai, PA-C  fluticasone (FLONASE) 50 MCG/ACT nasal spray Place 2 sprays into both nostrils daily. 02/12/20 02/11/21  Johnn Hai, PA-C  neomycin-polymyxin-pramoxine (NEOSPORIN PLUS) 1 % cream Apply topically 2 (two) times daily. Patient not  taking: Reported on 12/22/2022 08/04/19   Sable Feil, PA-C    Family History History reviewed. No pertinent family history.  Social History Social History   Tobacco Use   Smoking status: Never   Smokeless tobacco: Never  Substance Use Topics   Alcohol use: No   Drug use: No     Allergies   Patient has no known allergies.   Review of Systems Review of Systems  Constitutional:  Negative for fever.  HENT:  Positive for congestion, rhinorrhea and sore throat. Negative for ear pain.   Respiratory:  Positive for cough. Negative for shortness of breath and wheezing.   Neurological:  Positive for headaches.     Physical Exam Triage Vital Signs ED Triage Vitals  Enc Vitals Group     BP 12/27/22 1411 (!) 122/94     Pulse Rate 12/27/22 1411 85     Resp --      Temp 12/27/22 1411 98.6 F (37 C)     Temp Source 12/27/22 1411 Oral     SpO2 12/27/22 1411 99 %     Weight --      Height --      Head Circumference --      Peak Flow --      Pain Score 12/27/22 1410 7     Pain Loc --      Pain Edu? --      Excl. in [place]? --  No data found.  Updated Vital Signs BP (!) 122/94 (BP Location: Left Arm)   Pulse 85   Temp 98.6 F (37 C) (Oral)   LMP 12/15/2022   SpO2 99%   Visual Acuity Right Eye Distance:   Left Eye Distance:   Bilateral Distance:    Right Eye Near:   Left Eye Near:    Bilateral Near:     Physical Exam Vitals and nursing note reviewed.  Constitutional:      Appearance: Normal appearance. She is not ill-appearing.  HENT:     Head: Normocephalic and atraumatic.     Right Ear: Tympanic membrane, ear canal and external ear normal. There is no impacted cerumen.     Left Ear: Tympanic membrane, ear canal and external ear normal. There is no impacted cerumen.     Nose: Congestion and rhinorrhea present.     Comments: Nasal mucosa is erythematous and edematous with clear discharge in both nares.    Mouth/Throat:     Mouth: Mucous membranes are moist.      Pharynx: Oropharynx is clear. Posterior oropharyngeal erythema present. No oropharyngeal exudate.     Comments: 2+ edematous and erythematous tonsillar pillars but no appreciable exudate.  Posterior oropharynx has erythema and injection with clear postnasal drip. Cardiovascular:     Rate and Rhythm: Normal rate and regular rhythm.     Pulses: Normal pulses.     Heart sounds: Normal heart sounds. No murmur heard.    No friction rub. No gallop.  Pulmonary:     Effort: Pulmonary effort is normal.     Breath sounds: Normal breath sounds. No wheezing, rhonchi or rales.  Musculoskeletal:     Cervical back: Normal range of motion and neck supple.  Skin:    General: Skin is warm.     Findings: No erythema or rash.  Neurological:     Mental Status: She is alert.      UC Treatments / Results  Labs (all labs ordered are listed, but only abnormal results are displayed) Labs Reviewed  GROUP A STREP BY PCR  SARS CORONAVIRUS 2 BY RT PCR    EKG   Radiology No results found.  Procedures Procedures (including critical care time)  Medications Ordered in UC Medications - No data to display  Initial Impression / Assessment and Plan / UC Course  I have reviewed the triage vital signs and the nursing notes.  Pertinent labs & imaging results that were available during my care of the patient were reviewed by me and considered in my medical decision making (see chart for details).   Patient is a nontoxic-appearing 27 year old female here for evaluation of continued sore throat x 1 week without any fever.  Yesterday she developed a headache, nasal congestion, and a nonproductive cough.  She was evaluated at [facility] urgent care 5 days ago and had a negative rapid strep and a negative throat culture.  She also did negative COVID test at that time.  She was found the patient has now developed additional upper respiratory symptoms I will repeat the COVID testing in order PCR as well as order  strep PCR for evaluation of her continued sore throat.  Strep PCR is negative.  COVID PCR is negative.  I will discharge patient on the diagnosis of viral URI with a cough and treat her with Atrovent nasal spray, Tessalon Perles, and Promethazine DM cough syrup.  Over-the-counter Tylenol and/or ibuprofen as needed for fever and pain.   Final Clinical  Impressions(s) / UC Diagnoses   Final diagnoses:  Viral URI with cough     Discharge Instructions      Your testing today was negative for COVID and strep.  You have a viral respiratory infection.  This will take time to resolve.  Use over-the-counter Tylenol and/or ibuprofen according to package instructions as needed for fever or pain.  Use the Atrovent nasal spray, 2 squirts in each nostril every 6 hours, as needed for runny nose and postnasal drip.  Use the Tessalon Perles every 8 hours during the day.  Take them with a small sip of water.  They may give you some numbness to the base of your tongue or a metallic taste in your mouth, this is normal.  Use the Promethazine DM cough syrup at bedtime for cough and congestion.  It will make you drowsy so do not take it during the day.  Return for reevaluation or see your primary care provider for any new or worsening symptoms.      ED Prescriptions     Medication Sig Dispense Auth. Provider   benzonatate (TESSALON) 100 MG capsule Take 2 capsules (200 mg total) by mouth every 8 (eight) hours. 21 capsule Margarette Canada, NP   ipratropium (ATROVENT) 0.06 % nasal spray Place 2 sprays into both nostrils 4 (four) times daily. 15 mL Margarette Canada, NP   promethazine-dextromethorphan (PROMETHAZINE-DM) 6.25-15 MG/5ML syrup Take 5 mLs by mouth 4 (four) times daily as needed. 118 mL Margarette Canada, NP      PDMP not reviewed this encounter.   Margarette Canada, NP 12/27/22 [phone]

## 2023-04-29 ENCOUNTER — Other Ambulatory Visit: Payer: BC Managed Care – PPO

## 2023-06-01 ENCOUNTER — Ambulatory Visit: Payer: BC Managed Care – PPO | Admitting: Physician Assistant

## 2023-08-04 DIAGNOSIS — Z3009 Encounter for other general counseling and advice on contraception: Secondary | ICD-10-CM | POA: Diagnosis not present

## 2023-08-04 DIAGNOSIS — Z113 Encounter for screening for infections with a predominantly sexual mode of transmission: Secondary | ICD-10-CM | POA: Diagnosis not present

## 2023-12-27 ENCOUNTER — Ambulatory Visit: Payer: 59 | Admitting: Family Medicine

## 2023-12-27 ENCOUNTER — Encounter: Payer: Self-pay | Admitting: Family Medicine

## 2023-12-27 VITALS — BP 122/80 | HR 72 | Ht 61.0 in | Wt 143.0 lb

## 2023-12-27 DIAGNOSIS — K921 Melena: Secondary | ICD-10-CM | POA: Diagnosis not present

## 2023-12-27 DIAGNOSIS — K625 Hemorrhage of anus and rectum: Secondary | ICD-10-CM

## 2023-12-27 DIAGNOSIS — K5909 Other constipation: Secondary | ICD-10-CM | POA: Diagnosis not present

## 2023-12-27 DIAGNOSIS — Z3042 Encounter for surveillance of injectable contraceptive: Secondary | ICD-10-CM

## 2023-12-27 MED ORDER — MEDROXYPROGESTERONE ACETATE 150 MG/ML IM SUSY: PREFILLED_SYRINGE | INTRAMUSCULAR | Status: AC

## 2023-12-27 MED ORDER — LACTULOSE 10 GM/15ML PO SOLN: 10.0000 g | Freq: Every day | ORAL | 0 refills | Status: AC

## 2023-12-27 NOTE — Patient Instructions (Signed)
 VISIT SUMMARY:  Jean Knapp, a 28 year old female, visited to establish care as a new patient. She has a history of high blood pressure during pregnancy, chronic constipation with occasional rectal bleeding, and a past blood clot at birth. 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 She does not smoke, drinks alcohol occasionally, and has no history of drug use.  YOUR PLAN:  -CHRONIC CONSTIPATION: Chronic constipation means having infrequent bowel movements, often with difficulty. You should increase your water intake, continue using fiber gummies, and take Miralax (one capful twice daily). Add Activia yogurt for probiotics. If there is no improvement after one month, start lactulose 10g/60mL once daily, up to three times daily if needed. If symptoms persist, you will be referred to a gastroenterologist.  -RECTAL BLEEDING: Rectal bleeding can occur due to straining during bowel movements. Monitor for any changes in the bleeding pattern. If the bleeding persists, you will be referred to a gastroenterologist for further evaluation.  -HYPERTENSION: Hypertension is high blood pressure. Although you had high blood pressure during pregnancy, you currently have no symptoms. It is important to monitor your blood pressure regularly, especially given your family history.  -GENERAL HEALTH MAINTENANCE: You are due for a physical exam and routine screenings. Your last Pap smear in 2022 showed ASC-US, so you need regular Pap smears every three years until age 39. Routine lab tests will include diabetes screening, anemia panel, kidney and liver function tests, electrolytes, HIV, hepatitis C, and STI screening (gonorrhea, chlamydia).  INSTRUCTIONS:  Please schedule a follow-up appointment in April for your physical exam and Pap smear. Monitor for any changes in your symptoms and report them as needed. Contact gastroenterology to schedule an appointment if your  constipation or rectal bleeding persists.

## 2023-12-27 NOTE — Progress Notes (Unsigned)
 New patient visit   Patient: Jean Knapp   DOB: 15-Aug-1996   27 y.o. Female  MRN: [id number] Visit Date: 12/27/2023  Today's healthcare provider: Ronnald Ramp, MD   Chief Complaint  Patient presents with   Establish Care   Constipation    She has been dealing with constipation for years, she has been tested everything was neg, recently there has been blood in her stool she is not sure why when she dont have a regular BM   Contraception    She would like her Depo injection, he last one was 9/24.    Subjective    Jean Knapp is a 28 y.o. female who presents today as a new patient to establish care.   HPI     Constipation    Additional comments: She has been dealing with constipation for years, she has been tested everything was neg, recently there has been blood in her stool she is not sure why when she dont have a regular BM        Contraception    Additional comments: She would like her Depo injection, he last one was 9/24.       Last edited by Thedora Hinders, CMA on 12/27/2023  4:00 PM.       Discussed the use of AI scribe software for clinical note transcription with the patient, who gave verbal consent to proceed.  History of Present Illness   Jean Knapp is a 28 year old female who presents to establish care as a new patient.  She has a history of high blood pressure during her pregnancy but has not experienced any issues since giving birth and is not currently on any medication for it. There is a family history of hypertension, as both her mother and grandmother have it.  She experiences chronic constipation for several years, with bowel movements approximately twice a week. Stools are often accompanied by blood, which she attributes to straining, although she does not feel like she is straining. A past colonoscopy did not reveal significant findings. She has tried various treatments including fiber gummies and Miralax,  but her use of Miralax is inconsistent. She drinks water regularly, filling her bottle four to five times a day, but acknowledges that her water intake could be more consistent. She has also tried over-the-counter stool softeners.  She has a history of a blood clot at birth but has not had any issues since then and did not require blood thinners. No surgeries prior to her vaginal delivery of her daughter.  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 She denies smoking and reports occasional alcohol consumption, such as wine and mixed drinks, about two to three times a month. No use of illicit drugs.       Past Medical History:  Diagnosis Date   Seasonal allergies    Seizures (HCC)     Outpatient Medications Prior to Visit  Medication Sig   medroxyPROGESTERone Acetate 150 MG/ML SUSY every 3 (three) months   cetirizine (ZYRTEC ALLERGY) 10 MG tablet Take 1 tablet (10 mg total) by mouth daily.   fluticasone (FLONASE) 50 MCG/ACT nasal spray Place 2 sprays into both nostrils daily.   [DISCONTINUED] benzonatate (TESSALON) 100 MG capsule Take 2 capsules (200 mg total) by mouth every 8 (eight) hours. (Patient not taking: Reported on 12/27/2023)   [DISCONTINUED] ipratropium (ATROVENT) 0.06 % nasal spray Place 2 sprays into both nostrils 4 (  four) times daily.   [DISCONTINUED] neomycin-polymyxin-pramoxine (NEOSPORIN PLUS) 1 % cream Apply topically 2 (two) times daily. (Patient not taking: Reported on 12/27/2023)   [DISCONTINUED] promethazine-dextromethorphan (PROMETHAZINE-DM) 6.25-15 MG/5ML syrup Take 5 mLs by mouth 4 (four) times daily as needed. (Patient not taking: Reported on 12/27/2023)   No facility-administered medications prior to visit.    History reviewed. No pertinent surgical history. Family Status  Relation Name Status   Mother  Alive   Father  Alive  No partnership data on file   History reviewed. No pertinent family history. Social  History   Socioeconomic History   Marital status: Single    Spouse name: Not on file   Number of children: Not on file   Years of education: Not on file   Highest education level: Not on file  Occupational History   Not on file  Tobacco Use   Smoking status: Never   Smokeless tobacco: Never  Substance and Sexual Activity   Alcohol use: No   Drug use: No   Sexual activity: Yes  Other Topics Concern   Not on file  Social History Narrative   Not on file   Social Drivers of Health   Financial Resource Strain: Low Risk  (12/27/2023)   Overall Financial Resource Strain (CARDIA)    Difficulty of Paying Living Expenses: Not hard at all  Food Insecurity: No Food Insecurity (12/27/2023)   Hunger Vital Sign    Worried About Running Out of Food in the Last Year: Never true    Ran Out of Food in the Last Year: Never true  Transportation Needs: No Transportation Needs (12/27/2023)   PRAPARE - Administrator, Civil Service (Medical): No    Lack of Transportation (Non-Medical): No  Physical Activity: Insufficiently Active (12/27/2023)   Exercise Vital Sign    Days of Exercise per Week: 2 days    Minutes of Exercise per Session: 30 min  Stress: No Stress Concern Present (12/27/2023)   [employer] of Occupational Health - Occupational Stress Questionnaire    Feeling of Stress : Not at all  Social Connections: Moderately Isolated (12/27/2023)   Social Connection and Isolation Panel [NHANES]    Frequency of Communication with Friends and Family: More than three times a week    Frequency of Social Gatherings with Friends and Family: More than three times a week    Attends Religious Services: More than 4 times per year    Active Member of [employer] or Organizations: No    Attends Banker Meetings: Never    Marital Status: Never married     No Known Allergies  Immunization History  Administered Date(s) Administered   PPD Test 03/30/2022    Health Maintenance   Topic Date Due   HPV VACCINES (1 - 3-dose series) Never done   HIV Screening  Never done   Hepatitis C Screening  Never done   DTaP/Tdap/Td (1 - Tdap) Never done   INFLUENZA VACCINE  06/09/2023   COVID-19 Vaccine (1 - 2024-25 season) Never done   Cervical Cancer Screening (Pap smear)  10/05/2024    Patient Care Team: Ronnald Ramp, MD as PCP - General (Family Medicine)  Review of Systems  Last CBC Lab Results  Component Value Date   WBC 6.3 05/13/2018   HGB 14.0 05/13/2018   HCT 40.9 05/13/2018   MCV 87.1 05/13/2018   MCH 29.7 05/13/2018   RDW 13.1 05/13/2018   PLT 241 05/13/2018   Last metabolic panel  Lab Results  Component Value Date   GLUCOSE 114 (H) 05/13/2018   NA 141 05/13/2018   K 4.0 05/13/2018   CL 107 05/13/2018   CO2 23 05/13/2018   BUN 10 05/13/2018   CREATININE 0.86 05/13/2018   GFRNONAA >60 05/13/2018   CALCIUM 9.2 05/13/2018   PROT 6.8 05/13/2018   ALBUMIN 3.9 05/13/2018   BILITOT 0.9 05/13/2018   ALKPHOS 62 05/13/2018   AST 19 05/13/2018   ALT 19 05/13/2018   ANIONGAP 11 05/13/2018   Last lipids No results found for: "CHOL", "HDL", "LDLCALC", "LDLDIRECT", "TRIG", "CHOLHDL" Last hemoglobin A1c No results found for: "HGBA1C" Last thyroid functions No results found for: "TSH", "T3TOTAL", "T4TOTAL", "THYROIDAB" Last vitamin D No results found for: "25OHVITD2", "25OHVITD3", "VD25OH" Last vitamin B12 and Folate No results found for: "VITAMINB12", "FOLATE"      Objective    BP 122/80 (BP Location: Right Arm, Patient Position: Sitting, Cuff Size: Small)   Pulse 72   Ht 5\' 1"  (1.549 m)   Wt 143 lb (64.9 kg)   SpO2 100%   BMI 27.02 kg/m  BP Readings from Last 3 Encounters:  12/27/23 122/80  12/27/22 (!) 122/94  12/22/22 130/79   Wt Readings from Last 3 Encounters:  12/27/23 143 lb (64.9 kg)  10/28/21 145 lb (65.8 kg)  02/12/20 129 lb (58.5 kg)        Depression Screen    12/27/2023    4:28 PM  PHQ 2/9 Scores   PHQ - 2 Score 0  PHQ- 9 Score 0   Results for orders placed or performed in visit on 12/27/23  POCT urine pregnancy  Result Value Ref Range   Preg Test, Ur Negative Negative     Physical Exam Vitals reviewed.  Constitutional:      General: She is not in acute distress.    Appearance: Normal appearance. She is not ill-appearing, toxic-appearing or diaphoretic.  Eyes:     Conjunctiva/sclera: Conjunctivae normal.  Cardiovascular:     Rate and Rhythm: Normal rate and regular rhythm.     Pulses: Normal pulses.     Heart sounds: Normal heart sounds. No murmur heard.    No friction rub. No gallop.  Pulmonary:     Effort: Pulmonary effort is normal. No respiratory distress.     Breath sounds: Normal breath sounds. No stridor. No wheezing, rhonchi or rales.  Abdominal:     General: Bowel sounds are normal. There is no distension.     Palpations: Abdomen is soft.     Tenderness: There is no abdominal tenderness.  Genitourinary:    Rectum: No mass, tenderness, anal fissure or external hemorrhoid.  Musculoskeletal:     Right lower leg: No edema.     Left lower leg: No edema.  Skin:    Findings: No erythema or rash.  Neurological:     Mental Status: She is alert and oriented to person, place, and time.  Psychiatric:        Mood and Affect: Mood and affect normal.        Speech: Speech normal.        Behavior: Behavior normal. Behavior is cooperative.        Assessment & Plan      Problem List Items Addressed This Visit       Digestive   Chronic constipation   Chronic constipation with infrequent bowel movements (twice a week) and occasional blood in stools. Unclear etiology given hx of previous colonoscopy in 2019  that was unremarkable. Reports inconsistent use of Miralax and inadequate hydration. No signs of diarrhea or alternating bowel habits. Discussed importance of hydration and consistent Miralax use. If no improvement after one month of bowel regimen adjustments,  lactulose will be added.  - Increase water intake. - Continue fiber gummies as needed. - Use Miralax (one capful twice daily). - Add Activia yogurt for probiotics. - If no improvement after one month, start lactulose 10g/38mL once daily, up to three times daily if needed. - Referred to gastroenterology due to continued intermittent rectal bleeding       Relevant Medications   lactulose (CHRONULAC) 10 GM/15ML solution   Other Relevant Orders   Ambulatory referral to Gastroenterology     Other   Encounter for Depo-Provera contraception - Primary   Negative urine pregnancy test today Depo injection administered       Relevant Orders   POCT urine pregnancy (Completed)   Other Visit Diagnoses       Hematochezia       Relevant Orders   Ambulatory referral to Gastroenterology     Rectal bleeding             Rectal Bleeding Intermittent rectal bleeding with bowel movements, likely due to straining. No evidence of hemorrhoids or fissures on physical exam. Previous colonoscopy in 2019 was unremarkable. Further evaluation by gastroenterology if bleeding persists.reports two episodes in the last month  Chronic, intermittent  - Monitor for changes in bleeding pattern. - Refer to gastroenterology if bleeding persists.  Hx of Gestational Hypertension Hypertension during pregnancy, currently not on antihypertensive medications. No recent symptoms or signs of elevated blood pressure. Discussed importance of regular blood pressure monitoring given family history. - Monitor blood pressure regularly.  General Health Maintenance Due for physical exam and routine screenings. Last Pap smear in 2022 showed ASC-US. Discussed need for regular Pap smears every three years until age 24 and importance of routine lab tests. - Schedule physical exam in April 2025 - Perform Pap smear during physical exam.    Return in about 2 months (around 02/24/2024) for CPE.      Ronnald Ramp,  MD  [facility] [phone] (phone) [phone] (fax)  [facility]

## 2023-12-28 LAB — POCT URINE PREGNANCY: Preg Test, Ur: NEGATIVE

## 2023-12-28 NOTE — Assessment & Plan Note (Signed)
 Chronic constipation with infrequent bowel movements (twice a week) and occasional blood in stools. Unclear etiology given hx of previous colonoscopy in 2019 that was unremarkable. Reports inconsistent use of Miralax and inadequate hydration. No signs of diarrhea or alternating bowel habits. Discussed importance of hydration and consistent Miralax use. If no improvement after one month of bowel regimen adjustments, lactulose will be added.  - Increase water intake. - Continue fiber gummies as needed. - Use Miralax (one capful twice daily). - Add Activia yogurt for probiotics. - If no improvement after one month, start lactulose 10g/39mL once daily, up to three times daily if needed. - Referred to gastroenterology due to continued intermittent rectal bleeding

## 2023-12-28 NOTE — Assessment & Plan Note (Addendum)
 Negative urine pregnancy test today Depo injection administered

## 2024-01-25 ENCOUNTER — Ambulatory Visit: Payer: Self-pay

## 2024-01-25 NOTE — Telephone Encounter (Signed)
 Pt scheduled to see Dr.Fisher 01/27/24
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
## 2024-01-27 ENCOUNTER — Ambulatory Visit: Admitting: Family Medicine

## 2024-03-14 ENCOUNTER — Encounter: Admitting: Family Medicine

## 2024-03-18 ENCOUNTER — Ambulatory Visit
Admission: RE | Admit: 2024-03-18 | Discharge: 2024-03-18 | Disposition: A | Discharge status: Home / Self Care | Payer: Self-pay | Source: Ambulatory Visit | Attending: Emergency Medicine | Admitting: Emergency Medicine

## 2024-03-18 VITALS — BP 126/81 | HR 77 | Temp 98.3°F | Resp 18 | Ht 61.0 in | Wt 142.0 lb

## 2024-03-18 DIAGNOSIS — D229 Melanocytic nevi, unspecified: Secondary | ICD-10-CM | POA: Diagnosis not present

## 2024-03-18 MED ORDER — CEPHALEXIN 500 MG PO CAPS: 500.0000 mg | ORAL_CAPSULE | Freq: Two times a day (BID) | ORAL | 0 refills | Status: AC

## 2024-03-18 NOTE — ED Triage Notes (Signed)
 Patient states a skin tag/mole under right arm that has been there for years and was the color of her skin but in the past week it looks blood colored and is getting bigger and painful to put arm down

## 2024-03-18 NOTE — Discharge Instructions (Signed)
 You were evaluated for changes to the mole underneath your arm, based on the exam I do believe that the mole somehow became twisted and was experiencing limited blood flow causing the tissue to die which causes the pain  Mole has been removed at the surface level  You been placed on antibiotics prophylactically since skin was open by provider, take cephalexin twice daily for 5 days  Do not have to cover with Band-Aid  Cleanse wound daily with soap and water normal hydrating  Please schedule follow-up appointment with dermatologist for reevaluation

## 2024-03-18 NOTE — ED Provider Notes (Addendum)
 Jean Knapp    CSN: [id number] Arrival date & time: 03/18/24  3086      History   Chief Complaint Chief Complaint  Patient presents with   Follow-up    I wasn't a reason for visit that I was looking for, but I have a mole under my arm pit that's has became extremely red, swollen & painful. - Entered by patient   Mass    HPI Jean Knapp is a 28 y.o. female.   She presents for evaluation of redness, swelling and pain present to a mole underneath the right axilla for 7 days.  Endorses mole has been there for years without complication.  Has attempted alcohol, Neosporin  and Tylenol without relief.  Denies fever or drainage.  Past Medical History:  Diagnosis Date   Seasonal allergies    Seizures [facility])     Patient Active Problem List   Diagnosis Date Noted   Encounter for Depo-Provera  contraception 12/27/2023   Chronic constipation 12/27/2023    History reviewed. No pertinent surgical history.  OB History   No obstetric history on file.      Home Medications    Prior to Admission medications   Medication Sig Start Date End Date Taking? Authorizing Provider  cephALEXin (KEFLEX) 500 MG capsule Take 1 capsule (500 mg total) by mouth 2 (two) times daily for 5 days. 03/18/24 03/23/24 Yes Weylyn Ricciuti, Maybelle Spatz, NP  cetirizine  (ZYRTEC  ALLERGY) 10 MG tablet Take 1 tablet (10 mg total) by mouth daily. 02/12/20 03/13/20  Stafford Eagles, PA-C  fluticasone  (FLONASE ) 50 MCG/ACT nasal spray Place 2 sprays into both nostrils daily. 02/12/20 02/11/21  Stafford Eagles, PA-C  lactulose  (CHRONULAC ) 10 GM/15ML solution Take 15 mLs (10 g total) by mouth daily. 12/27/23   Simmons-Robinson, Judyann Number, MD  medroxyPROGESTERone  Acetate 150 MG/ML SUSY every 3 (three) months 01/27/18   [provider]    Family History History reviewed. No pertinent family history.  Social History Social History   Tobacco Use   Smoking status: Never   Smokeless tobacco: Never  Vaping  Use   Vaping status: Never Used  Substance Use Topics   Alcohol use: No   Drug use: No     Allergies   Patient has no known allergies.   Review of Systems Review of Systems   Physical Exam Triage Vital Signs ED Triage Vitals  Encounter Vitals Group     BP 03/18/24 0957 126/81     Systolic BP Percentile --      Diastolic BP Percentile --      Pulse Rate 03/18/24 0957 77     Resp 03/18/24 0957 18     Temp 03/18/24 0957 98.3 F (36.8 C)     Temp Source 03/18/24 0957 Oral     SpO2 03/18/24 0957 98 %     Weight 03/18/24 0955 142 lb (64.4 kg)     Height 03/18/24 0955 5\' 1"  (1.549 m)     Head Circumference --      Peak Flow --      Pain Score 03/18/24 0955 9     Pain Loc --      Pain Education --      Exclude from Growth Chart --    No data found.  Updated Vital Signs BP 126/81 (BP Location: Left Arm)   Pulse 77   Temp 98.3 F (36.8 C) (Oral)   Resp 18   Ht 5\' 1"  (1.549 m)   Wt  142 lb (64.4 kg)   SpO2 98%   BMI 26.83 kg/m   Visual Acuity Right Eye Distance:   Left Eye Distance:   Bilateral Distance:    Right Eye Near:   Left Eye Near:    Bilateral Near:     Physical Exam Constitutional:      Appearance: Normal appearance.  Eyes:     Extraocular Movements: Extraocular movements intact.  Pulmonary:     Effort: Pulmonary effort is normal.  Skin:    Comments: Defer to photo   Neurological:     Mental Status: She is alert and oriented to person, place, and time.      UC Treatments / Results  Labs (all labs ordered are listed, but only abnormal results are displayed) Labs Reviewed - No data to display  EKG   Radiology No results found.  Procedures Procedures (including critical care time)  Medications Ordered in UC Medications - No data to display  Initial Impression / Assessment and Plan / UC Course  I have reviewed the triage vital signs and the nursing notes.  Pertinent labs & imaging results that were available during my care of  the patient were reviewed by me and considered in my medical decision making (see chart for details).  Nevus  Darkening discoloration and causing severe pain making it difficult to relax arm, concern for decreased blood flow and dying tissue therefore removed in clinic using lidocaine  mixed in #11 blade, instant relief of pain upon removal empirically placed on cephalexin although no signs of infection on exam, advised daily cleansing and monitoring, walking referral given to dermatology for follow-up Final Clinical Impressions(s) / UC Diagnoses   Final diagnoses:  Nevus     Discharge Instructions      You were evaluated for changes to the mole underneath your arm, based on the exam I do believe that the mole somehow became twisted and was experiencing limited blood flow causing the tissue to die which causes the pain  Mole has been removed at the surface level  You been placed on antibiotics prophylactically since skin was open by provider, take cephalexin twice daily for 5 days  Do not have to cover with Band-Aid  Cleanse wound daily with soap and water normal hydrating  Please schedule follow-up appointment with dermatologist for reevaluation   ED Prescriptions     Medication Sig Dispense Auth. Provider   cephALEXin (KEFLEX) 500 MG capsule Take 1 capsule (500 mg total) by mouth 2 (two) times daily for 5 days. 10 capsule Keylie Beavers R, NP      PDMP not reviewed this encounter.   Reena Canning, NP 03/18/24 1018    Alayne Allis R, NP 03/18/24 1019

## 2024-03-20 ENCOUNTER — Telehealth: Payer: Self-pay

## 2024-03-20 NOTE — Telephone Encounter (Signed)
 Copied from CRM [id number]. Topic: General - Call Back - No Documentation >> Mar 20, 2024  1:41 PM Stanly Early wrote: Reason for CRM: [phone]  Patient called. Wants to know what the deadline is to schedule next depo shot. She's been waiting for a callback since yesterday.

## 2024-03-21 NOTE — Telephone Encounter (Signed)
 Left vm advising pt to call back to schedule an appt on or before 03/27/2024 for depo as advised by CMA

## 2024-04-04 ENCOUNTER — Ambulatory Visit (INDEPENDENT_AMBULATORY_CARE_PROVIDER_SITE_OTHER): Admitting: Physician Assistant

## 2024-04-04 DIAGNOSIS — Z3042 Encounter for surveillance of injectable contraceptive: Secondary | ICD-10-CM | POA: Diagnosis not present

## 2024-04-04 MED ORDER — MEDROXYPROGESTERONE ACETATE 150 MG/ML IM SUSY
150.0000 mg | PREFILLED_SYRINGE | INTRAMUSCULAR | Status: AC
  Administered 2024-04-04: 150 mg via INTRAMUSCULAR

## 2024-04-04 NOTE — Progress Notes (Signed)
 Depo injection administered by CMA Kae Oram

## 2024-09-03 ENCOUNTER — Telehealth: Payer: Self-pay

## 2024-09-03 NOTE — Telephone Encounter (Unsigned)
 Copied from CRM [id number]. Topic: Appointments - Scheduling Inquiry for Clinic >> Sep 03, 2024  3:10 PM Jean Knapp wrote: Reason for CRM: Patient wants to schedule a Dep Provera  sot please return call to patient

## 2024-09-03 NOTE — Telephone Encounter (Signed)
 Please advise when patient is due?

## 2024-09-04 NOTE — Telephone Encounter (Signed)
 Called and advised patient that depo is overdue, pt aware she needs to give urine sample for urine preg before inj is given. Appt made for 11/3

## 2024-09-10 ENCOUNTER — Ambulatory Visit: Admitting: Family Medicine

## 2024-09-25 ENCOUNTER — Encounter: Payer: Self-pay | Admitting: Family Medicine

## 2024-10-09 NOTE — Progress Notes (Deleted)
      Established patient visit   Patient: Jean Knapp   DOB: [birth year]   28 y.o. Female  MRN: [id number] Visit Date: 10/10/2024  Today's healthcare provider: Isaiah DELENA Pepper, MD   No chief complaint on file.  Subjective    HPI  Discussed the use of AI scribe software for clinical note transcription with the patient, who gave verbal consent to proceed.  History of Present Illness      Medications: Outpatient Medications Prior to Visit  Medication Sig   cetirizine  (ZYRTEC  ALLERGY) 10 MG tablet Take 1 tablet (10 mg total) by mouth daily.   fluticasone  (FLONASE ) 50 MCG/ACT nasal spray Place 2 sprays into both nostrils daily.   lactulose  (CHRONULAC ) 10 GM/15ML solution Take 15 mLs (10 g total) by mouth daily.   medroxyPROGESTERone  Acetate 150 MG/ML SUSY every 3 (three) months   Facility-Administered Medications Prior to Visit  Medication Dose Route Frequency Provider   medroxyPROGESTERone  Acetate SUSY 150 mg  150 mg Intramuscular Q90 days     Review of Systems as noted in HPI.  {Insert previous labs (optional):23779} {See past labs  Heme  Chem  Endocrine  Serology  Results Review (optional):1}   Objective    There were no vitals taken for this visit. {Insert last BP/Wt (optional):23777}{See vitals history (optional):1}  Physical Exam   No results found for any visits on 10/10/24.  Assessment & Plan     Problem List Items Addressed This Visit   None   Assessment and Plan Assessment & Plan       No follow-ups on file.       Isaiah DELENA Pepper, MD  [facility] [phone] (phone) [phone] (fax)

## 2024-10-10 ENCOUNTER — Ambulatory Visit

## 2024-10-12 ENCOUNTER — Ambulatory Visit

## 2024-10-12 ENCOUNTER — Other Ambulatory Visit (HOSPITAL_COMMUNITY)
Admission: RE | Admit: 2024-10-12 | Discharge: 2024-10-12 | Disposition: A | Discharge status: Home / Self Care | Source: Ambulatory Visit

## 2024-10-12 VITALS — BP 134/91 | HR 65 | Ht 61.0 in | Wt 148.7 lb

## 2024-10-12 DIAGNOSIS — Z124 Encounter for screening for malignant neoplasm of cervix: Secondary | ICD-10-CM

## 2024-10-12 DIAGNOSIS — Z3202 Encounter for pregnancy test, result negative: Secondary | ICD-10-CM

## 2024-10-12 DIAGNOSIS — Z3042 Encounter for surveillance of injectable contraceptive: Secondary | ICD-10-CM

## 2024-10-12 DIAGNOSIS — Z113 Encounter for screening for infections with a predominantly sexual mode of transmission: Secondary | ICD-10-CM

## 2024-10-12 LAB — POCT URINE PREGNANCY: Preg Test, Ur: NEGATIVE

## 2024-10-12 MED ORDER — MEDROXYPROGESTERONE ACETATE 150 MG/ML IM SUSY
150.0000 mg | PREFILLED_SYRINGE | INTRAMUSCULAR | Status: AC
  Administered 2024-10-12: 150 mg via INTRAMUSCULAR

## 2024-10-12 NOTE — Progress Notes (Signed)
 Established patient visit   Patient: Jean Knapp   DOB: 11-13-1995   28 y.o. Female  MRN: [id number] Visit Date: 10/12/2024  Today's healthcare provider: Isaiah DELENA Pepper, MD   Chief Complaint  Patient presents with   Gynecologic Exam    Patient is present for pap, STI testing and pregancy testing.   Patient would like to receive depo today if urine is neg    Vaginal Discharge   Subjective    HPI  Discussed the use of AI scribe software for clinical note transcription with the patient, who gave verbal consent to proceed.  History of Present Illness Jean Knapp is a 28 year old female who presents for a routine Pap smear and STD testing.  She has no history of abnormal Pap smears or cervical biopsies and has no specific concerns regarding STDs but desires routine screening.  She has experienced itching in the genital area but denies any urinary burning, pain, sores, or rashes.  She is considering resuming Depo-Provera  injections for contraception, as it has been a while since her last shot.   Medications: Outpatient Medications Prior to Visit  Medication Sig   cetirizine  (ZYRTEC  ALLERGY) 10 MG tablet Take 1 tablet (10 mg total) by mouth daily.   fluticasone  (FLONASE ) 50 MCG/ACT nasal spray Place 2 sprays into both nostrils daily.   lactulose  (CHRONULAC ) 10 GM/15ML solution Take 15 mLs (10 g total) by mouth daily.   medroxyPROGESTERone  Acetate 150 MG/ML SUSY every 3 (three) months   Facility-Administered Medications Prior to Visit  Medication Dose Route Frequency Provider   medroxyPROGESTERone  Acetate SUSY 150 mg  150 mg Intramuscular Q90 days     Review of Systems as noted in HPI.       Objective    BP (!) 134/91 (BP Location: Left Arm, Patient Position: Sitting, Cuff Size: Normal)   Pulse 65   Ht 5' 1 (1.549 m)   Wt 148 lb 11.2 oz (67.4 kg)   SpO2 100%   BMI 28.10 kg/m     Physical Exam Exam conducted with a chaperone present.   Constitutional:      Appearance: Normal appearance.  HENT:     Head: Normocephalic and atraumatic.     Mouth/Throat:     Mouth: Mucous membranes are moist.  Eyes:     Pupils: Pupils are equal, round, and reactive to light.  Pulmonary:     Effort: Pulmonary effort is normal.  Genitourinary:    General: Normal vulva.     Vagina: Normal.     Cervix: Normal.  Skin:    General: Skin is warm.  Neurological:     General: No focal deficit present.     Mental Status: She is alert.      Results for orders placed or performed in visit on 10/12/24  POCT urine pregnancy  Result Value Ref Range   Preg Test, Ur Negative Negative    Assessment & Plan     Problem List Items Addressed This Visit       Other   Encounter for Depo-Provera  contraception   Other Visit Diagnoses       Screen for STD (sexually transmitted disease)    -  Primary   Relevant Orders   HIV antibody (with reflex)   Hepatitis C Antibody   Cervicovaginal ancillary only   RPR W/RFLX TO RPR TITER, TREPONEMAL AB, SCREEN AND DIAGNOSIS     Pregnancy examination or test, negative result  Relevant Orders   POCT urine pregnancy (Completed)     Screening for cervical cancer       Relevant Orders   Cytology - PAP      Assessment & Plan Screening for sexually transmitted infections Routine STI screening conducted. Blood tests for syphilis and HIV ordered. Swab tests for other STIs performed. - Ordered blood tests for syphilis and HIV. - Performed swab tests for other STIs.  Screening for malignant neoplasm of cervix Pap smear performed for routine cervical cancer screening. Last pap smear in 2022 showed ASC-US .  - Performed Pap smear. - Await Pap smear results in about a week.  Surveillance of injectable contraceptive Urine pregnancy test negative today. Depo-Provera  injection administered. Effective for three months. - Administered Depo-Provera  injection today. - Advised to return in three months for  the next injection.   Return in about 3 months (around 01/10/2025) for Depo Shot.       Isaiah DELENA Pepper, MD  [facility] [phone] (phone) [phone] (fax)

## 2024-10-12 NOTE — Patient Instructions (Signed)
 Return between 12/28/2024 and 01/11/2025 for depo provera  injection

## 2024-10-13 LAB — SYPHILIS: RPR W/REFLEX TO RPR TITER AND TREPONEMAL ANTIBODIES, TRADITIONAL SCREENING AND DIAGNOSIS ALGORITHM: RPR Ser Ql: NONREACTIVE

## 2024-10-13 LAB — HIV ANTIBODY (ROUTINE TESTING W REFLEX): HIV Screen 4th Generation wRfx: NONREACTIVE

## 2024-10-13 LAB — HEPATITIS C ANTIBODY: Hep C Virus Ab: NONREACTIVE

## 2024-10-16 ENCOUNTER — Ambulatory Visit: Payer: Self-pay

## 2024-10-16 DIAGNOSIS — B9689 Other specified bacterial agents as the cause of diseases classified elsewhere: Secondary | ICD-10-CM

## 2024-10-16 DIAGNOSIS — B3731 Acute candidiasis of vulva and vagina: Secondary | ICD-10-CM

## 2024-10-16 LAB — CERVICOVAGINAL ANCILLARY ONLY
Bacterial Vaginitis (gardnerella): POSITIVE — AB
Candida Glabrata: NEGATIVE
Candida Vaginitis: POSITIVE — AB
Comment: NEGATIVE
Comment: NEGATIVE
Comment: NEGATIVE

## 2024-10-16 MED ORDER — FLUCONAZOLE 150 MG PO TABS: 150.0000 mg | ORAL_TABLET | Freq: Once | ORAL | 0 refills | Status: AC

## 2024-10-16 MED ORDER — METRONIDAZOLE 500 MG PO TABS: 500.0000 mg | ORAL_TABLET | Freq: Two times a day (BID) | ORAL | 0 refills | Status: AC

## 2024-10-17 LAB — CYTOLOGY - PAP
Chlamydia: NEGATIVE
Comment: NEGATIVE
Comment: NEGATIVE
Comment: NORMAL
Neisseria Gonorrhea: NEGATIVE
Trichomonas: NEGATIVE

## 2024-10-29 NOTE — Telephone Encounter (Signed)
 Please let patient know that she should schedule an appointment if she is having recurrent symptoms to re-test.

## 2024-11-12 ENCOUNTER — Ambulatory Visit

## 2024-11-15 ENCOUNTER — Ambulatory Visit: Admitting: Family Medicine

## 2024-11-16 ENCOUNTER — Ambulatory Visit: Admitting: Family Medicine

## 2024-11-19 ENCOUNTER — Ambulatory Visit: Admitting: Family Medicine

## 2024-11-21 NOTE — Progress Notes (Unsigned)
" ° °  Referring Provider:  ***  HPI:  Jean Knapp is a 29 y.o.  No obstetric history on file.  who presents today for evaluation and management of abnormal cervical cytology.    Prior pap smears:  10/12/2024:  LSIL (no HPV testing done) 10/05/2021:  ASCUS (no HPV testing done)   Prior cervical / vaginal findings: NA   Prior cervical treatment(s): NA   Symptoms/History:  -Abnormal vaginal discharge: *** -Postmenopausal: *** -Intermenstrual bleeding: *** -Postcoital bleeding: *** -Bleeding problems (non-gyn): *** -Contraception: *** -Number of current sexual partners: *** -Number of partners in lifetime: *** -History of a high risk partner: *** -History of STDs: *** -Smoking: *** -Gardasil Vaccine: ***      ROS:  {Ros - complete:30496}  OB History  No obstetric history on file.    Past Medical History:  Diagnosis Date   Seasonal allergies    Seizures (HCC)     No past surgical history on file.  SOCIAL HISTORY:  Social History   Substance and Sexual Activity  Alcohol Use No    Social History   Substance and Sexual Activity  Drug Use No     No family history on file.  ALLERGIES:  Patient has no known allergies.  She has a current medication list which includes the following prescription(s): cetirizine , fluticasone , lactulose , medroxyprogesterone  acetate, and metronidazole , and the following Facility-Administered Medications: medroxyprogesterone  acetate.  Physical Exam: -Vitals:  There were no vitals taken for this visit.  PROCEDURE: Colposcopy performed with 4% acetic acid and Lugol's after informed consent obtained. Urine pregnancy test negative***.  Physical Exam                            -Aceto-white Lesions Location(s): See above              -Biopsy performed at *** o'clock               -ECC indicated and performed: {yes no:314532}     -Biopsy sites made hemostatic with pressure and Monsel's solution   -Satisfactory colposcopy:  {yes no:314532}    -Evidence of Invasive cervical CA :  NO  ASSESSMENT:  Jean Knapp is a 29 y.o. No obstetric history on file. 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  -ECC and *** cervical bx sent to pathology -Aftercare instructions for home reviewed, si/sx of when to call/return discussed. -Gardasil counseling done, #1 given today *** pt to consider.  -Discussed possible outcomes based on pathology; will call with results   Estil Mangle, DO [facility] "

## 2024-11-27 ENCOUNTER — Encounter: Admitting: Obstetrics

## 2024-11-27 DIAGNOSIS — Z01812 Encounter for preprocedural laboratory examination: Secondary | ICD-10-CM

## 2024-11-27 DIAGNOSIS — R87612 Low grade squamous intraepithelial lesion on cytologic smear of cervix (LGSIL): Secondary | ICD-10-CM

## 2024-12-19 ENCOUNTER — Encounter: Admitting: Obstetrics

## 2025-01-14 ENCOUNTER — Ambulatory Visit: Admitting: Family Medicine
